# Patient Record
Sex: Male | Born: 1960 | Race: Black or African American | Hispanic: No | Marital: Married | Smoking: Never smoker
Health system: Southern US, Community
[De-identification: ages and names within clinical notes are randomized; demographics above are authoritative.]

## PROBLEM LIST (undated history)

## (undated) DIAGNOSIS — I10 Essential (primary) hypertension: Secondary | ICD-10-CM

---

## 2016-06-22 ENCOUNTER — Inpatient Hospital Stay (HOSPITAL_COMMUNITY): Payer: BLUE CROSS/BLUE SHIELD

## 2016-06-22 ENCOUNTER — Inpatient Hospital Stay (HOSPITAL_COMMUNITY)
Admission: EM | Admit: 2016-06-22 | Discharge: 2016-06-30 | DRG: 640 | Disposition: A | Discharge status: Home Health | Payer: BLUE CROSS/BLUE SHIELD | Attending: Family Medicine | Admitting: Family Medicine

## 2016-06-22 ENCOUNTER — Encounter (HOSPITAL_COMMUNITY): Payer: Self-pay | Admitting: Emergency Medicine

## 2016-06-22 ENCOUNTER — Emergency Department (HOSPITAL_COMMUNITY): Payer: BLUE CROSS/BLUE SHIELD

## 2016-06-22 DIAGNOSIS — N390 Urinary tract infection, site not specified: Secondary | ICD-10-CM | POA: Diagnosis present

## 2016-06-22 DIAGNOSIS — I951 Orthostatic hypotension: Secondary | ICD-10-CM

## 2016-06-22 DIAGNOSIS — K529 Noninfective gastroenteritis and colitis, unspecified: Secondary | ICD-10-CM | POA: Diagnosis present

## 2016-06-22 DIAGNOSIS — F10239 Alcohol dependence with withdrawal, unspecified: Secondary | ICD-10-CM | POA: Diagnosis present

## 2016-06-22 DIAGNOSIS — R42 Dizziness and giddiness: Secondary | ICD-10-CM | POA: Diagnosis present

## 2016-06-22 DIAGNOSIS — E46 Unspecified protein-calorie malnutrition: Secondary | ICD-10-CM | POA: Diagnosis present

## 2016-06-22 DIAGNOSIS — E43 Unspecified severe protein-calorie malnutrition: Secondary | ICD-10-CM | POA: Diagnosis present

## 2016-06-22 DIAGNOSIS — R531 Weakness: Secondary | ICD-10-CM | POA: Diagnosis not present

## 2016-06-22 DIAGNOSIS — R627 Adult failure to thrive: Secondary | ICD-10-CM | POA: Diagnosis present

## 2016-06-22 DIAGNOSIS — R296 Repeated falls: Secondary | ICD-10-CM | POA: Diagnosis present

## 2016-06-22 DIAGNOSIS — I10 Essential (primary) hypertension: Secondary | ICD-10-CM | POA: Diagnosis present

## 2016-06-22 DIAGNOSIS — R5381 Other malaise: Secondary | ICD-10-CM | POA: Diagnosis not present

## 2016-06-22 DIAGNOSIS — Z86718 Personal history of other venous thrombosis and embolism: Secondary | ICD-10-CM | POA: Diagnosis not present

## 2016-06-22 DIAGNOSIS — R29898 Other symptoms and signs involving the musculoskeletal system: Secondary | ICD-10-CM

## 2016-06-22 DIAGNOSIS — D638 Anemia in other chronic diseases classified elsewhere: Secondary | ICD-10-CM | POA: Diagnosis present

## 2016-06-22 DIAGNOSIS — R945 Abnormal results of liver function studies: Secondary | ICD-10-CM

## 2016-06-22 DIAGNOSIS — E876 Hypokalemia: Secondary | ICD-10-CM | POA: Diagnosis present

## 2016-06-22 DIAGNOSIS — Z6820 Body mass index (BMI) 20.0-20.9, adult: Secondary | ICD-10-CM | POA: Diagnosis not present

## 2016-06-22 DIAGNOSIS — Z833 Family history of diabetes mellitus: Secondary | ICD-10-CM

## 2016-06-22 DIAGNOSIS — R7989 Other specified abnormal findings of blood chemistry: Secondary | ICD-10-CM | POA: Diagnosis not present

## 2016-06-22 DIAGNOSIS — F1010 Alcohol abuse, uncomplicated: Secondary | ICD-10-CM

## 2016-06-22 DIAGNOSIS — F10288 Alcohol dependence with other alcohol-induced disorder: Secondary | ICD-10-CM

## 2016-06-22 DIAGNOSIS — F1020 Alcohol dependence, uncomplicated: Secondary | ICD-10-CM | POA: Diagnosis present

## 2016-06-22 DIAGNOSIS — I82409 Acute embolism and thrombosis of unspecified deep veins of unspecified lower extremity: Secondary | ICD-10-CM | POA: Diagnosis present

## 2016-06-22 DIAGNOSIS — Z8249 Family history of ischemic heart disease and other diseases of the circulatory system: Secondary | ICD-10-CM | POA: Diagnosis not present

## 2016-06-22 DIAGNOSIS — D6489 Other specified anemias: Secondary | ICD-10-CM | POA: Diagnosis present

## 2016-06-22 DIAGNOSIS — D649 Anemia, unspecified: Secondary | ICD-10-CM

## 2016-06-22 DIAGNOSIS — R160 Hepatomegaly, not elsewhere classified: Secondary | ICD-10-CM

## 2016-06-22 DIAGNOSIS — R260 Ataxic gait: Secondary | ICD-10-CM

## 2016-06-22 HISTORY — DX: Essential (primary) hypertension: I10

## 2016-06-22 LAB — IRON AND TIBC
Iron: 144 ug/dL (ref 45–182)
SATURATION RATIOS: 87 % — AB (ref 17.9–39.5)
TIBC: 165 ug/dL — AB (ref 250–450)
UIBC: 21 ug/dL

## 2016-06-22 LAB — COMPREHENSIVE METABOLIC PANEL
ALK PHOS: 140 U/L — AB (ref 38–126)
ALT: 27 U/L (ref 17–63)
ANION GAP: 19 — AB (ref 5–15)
AST: 114 U/L — ABNORMAL HIGH (ref 15–41)
Albumin: 3.5 g/dL (ref 3.5–5.0)
BILIRUBIN TOTAL: 2.6 mg/dL — AB (ref 0.3–1.2)
BUN: 12 mg/dL (ref 6–20)
CALCIUM: 9.1 mg/dL (ref 8.9–10.3)
CO2: 29 mmol/L (ref 22–32)
Chloride: 85 mmol/L — ABNORMAL LOW (ref 101–111)
Creatinine, Ser: 1.14 mg/dL (ref 0.61–1.24)
Glucose, Bld: 113 mg/dL — ABNORMAL HIGH (ref 65–99)
Potassium: 2.8 mmol/L — ABNORMAL LOW (ref 3.5–5.1)
SODIUM: 133 mmol/L — AB (ref 135–145)
TOTAL PROTEIN: 7.1 g/dL (ref 6.5–8.1)

## 2016-06-22 LAB — URINALYSIS, ROUTINE W REFLEX MICROSCOPIC
GLUCOSE, UA: NEGATIVE mg/dL
Ketones, ur: 15 mg/dL — AB
LEUKOCYTES UA: NEGATIVE
Nitrite: POSITIVE — AB
Protein, ur: 100 mg/dL — AB
SPECIFIC GRAVITY, URINE: 1.02 (ref 1.005–1.030)
pH: 6 (ref 5.0–8.0)

## 2016-06-22 LAB — URINE MICROSCOPIC-ADD ON

## 2016-06-22 LAB — VITAMIN B12: Vitamin B-12: 660 pg/mL (ref 180–914)

## 2016-06-22 LAB — TSH: TSH: 1.934 u[IU]/mL (ref 0.350–4.500)

## 2016-06-22 LAB — CBC
HCT: 24.5 % — ABNORMAL LOW (ref 39.0–52.0)
Hemoglobin: 8.5 g/dL — ABNORMAL LOW (ref 13.0–17.0)
MCH: 29.4 pg (ref 26.0–34.0)
MCHC: 34.7 g/dL (ref 30.0–36.0)
MCV: 84.8 fL (ref 78.0–100.0)
PLATELETS: 145 10*3/uL — AB (ref 150–400)
RBC: 2.89 MIL/uL — AB (ref 4.22–5.81)
RDW: 15.3 % (ref 11.5–15.5)
WBC: 5.8 10*3/uL (ref 4.0–10.5)

## 2016-06-22 LAB — MAGNESIUM: MAGNESIUM: 0.9 mg/dL — AB (ref 1.7–2.4)

## 2016-06-22 LAB — RAPID URINE DRUG SCREEN, HOSP PERFORMED
AMPHETAMINES: NOT DETECTED
BENZODIAZEPINES: NOT DETECTED
Barbiturates: NOT DETECTED
COCAINE: NOT DETECTED
OPIATES: NOT DETECTED
Tetrahydrocannabinol: NOT DETECTED

## 2016-06-22 LAB — FERRITIN: Ferritin: 2671 ng/mL — ABNORMAL HIGH (ref 24–336)

## 2016-06-22 LAB — ETHANOL: ALCOHOL ETHYL (B): 8 mg/dL — AB (ref <5)

## 2016-06-22 LAB — CBG MONITORING, ED: Glucose-Capillary: 99 mg/dL (ref 65–99)

## 2016-06-22 MED ORDER — VITAMIN B-1 100 MG PO TABS
100.0000 mg | ORAL_TABLET | Freq: Every day | ORAL | Status: DC
  Administered 2016-06-22 – 2016-06-30 (×9): 100 mg via ORAL
  Filled 2016-06-22 (×9): qty 1

## 2016-06-22 MED ORDER — APIXABAN 5 MG PO TABS
5.0000 mg | ORAL_TABLET | Freq: Two times a day (BID) | ORAL | Status: DC
  Administered 2016-06-22 – 2016-06-30 (×16): 5 mg via ORAL
  Filled 2016-06-22 (×17): qty 1

## 2016-06-22 MED ORDER — MAGNESIUM SULFATE 2 GM/50ML IV SOLN
2.0000 g | Freq: Once | INTRAVENOUS | Status: AC
  Administered 2016-06-22: 2 g via INTRAVENOUS
  Filled 2016-06-22: qty 50

## 2016-06-22 MED ORDER — LORAZEPAM 2 MG/ML IJ SOLN
0.0000 mg | Freq: Two times a day (BID) | INTRAMUSCULAR | Status: DC
Start: 2016-06-24 — End: 2016-06-23

## 2016-06-22 MED ORDER — MAGNESIUM OXIDE 400 (241.3 MG) MG PO TABS
800.0000 mg | ORAL_TABLET | Freq: Two times a day (BID) | ORAL | Status: DC
  Administered 2016-06-22 – 2016-06-30 (×16): 800 mg via ORAL
  Filled 2016-06-22 (×20): qty 2

## 2016-06-22 MED ORDER — POTASSIUM CHLORIDE CRYS ER 20 MEQ PO TBCR
60.0000 meq | EXTENDED_RELEASE_TABLET | Freq: Once | ORAL | Status: AC
  Administered 2016-06-22: 60 meq via ORAL
  Filled 2016-06-22: qty 3

## 2016-06-22 MED ORDER — RAMIPRIL 5 MG PO CAPS
10.0000 mg | ORAL_CAPSULE | Freq: Every day | ORAL | Status: DC
  Administered 2016-06-23 – 2016-06-25 (×3): 10 mg via ORAL
  Filled 2016-06-22 (×3): qty 2

## 2016-06-22 MED ORDER — POTASSIUM CHLORIDE CRYS ER 20 MEQ PO TBCR: 40.0000 meq | EXTENDED_RELEASE_TABLET | Freq: Two times a day (BID) | ORAL | Status: DC

## 2016-06-22 MED ORDER — VERAPAMIL HCL ER 240 MG PO TBCR
240.0000 mg | EXTENDED_RELEASE_TABLET | Freq: Every day | ORAL | Status: DC
  Administered 2016-06-23 – 2016-06-25 (×3): 240 mg via ORAL
  Filled 2016-06-22 (×3): qty 1

## 2016-06-22 MED ORDER — FOLIC ACID 1 MG PO TABS
1.0000 mg | ORAL_TABLET | Freq: Every day | ORAL | Status: DC
  Administered 2016-06-22 – 2016-06-30 (×9): 1 mg via ORAL
  Filled 2016-06-22 (×9): qty 1

## 2016-06-22 MED ORDER — LORAZEPAM 2 MG/ML IJ SOLN
0.0000 mg | Freq: Four times a day (QID) | INTRAMUSCULAR | Status: DC
Start: 2016-06-22 — End: 2016-06-23
  Administered 2016-06-22 – 2016-06-23 (×3): 1 mg via INTRAVENOUS
  Administered 2016-06-23: 3 mg via INTRAVENOUS
  Filled 2016-06-22: qty 1
  Filled 2016-06-22: qty 2
  Filled 2016-06-22 (×2): qty 1

## 2016-06-22 MED ORDER — LORAZEPAM 2 MG/ML IJ SOLN
1.0000 mg | Freq: Four times a day (QID) | INTRAMUSCULAR | Status: DC | PRN
  Administered 2016-06-22: 1 mg via INTRAVENOUS
  Filled 2016-06-22: qty 1

## 2016-06-22 MED ORDER — POTASSIUM CHLORIDE 20 MEQ/15ML (10%) PO SOLN
ORAL | Status: AC
  Administered 2016-06-22: 16:00:00
  Filled 2016-06-22: qty 30

## 2016-06-22 MED ORDER — FENOFIBRATE 54 MG PO TABS
54.0000 mg | ORAL_TABLET | Freq: Every day | ORAL | Status: DC
  Administered 2016-06-23 – 2016-06-30 (×8): 54 mg via ORAL
  Filled 2016-06-22 (×9): qty 1

## 2016-06-22 MED ORDER — SODIUM CHLORIDE 0.9 % IV BOLUS (SEPSIS)
1000.0000 mL | Freq: Once | INTRAVENOUS | Status: AC
  Administered 2016-06-22: 1000 mL via INTRAVENOUS

## 2016-06-22 MED ORDER — ONDANSETRON HCL 4 MG/2ML IJ SOLN: 4.0000 mg | Freq: Four times a day (QID) | INTRAMUSCULAR | Status: DC | PRN

## 2016-06-22 MED ORDER — POTASSIUM CHLORIDE CRYS ER 20 MEQ PO TBCR
60.0000 meq | EXTENDED_RELEASE_TABLET | Freq: Two times a day (BID) | ORAL | Status: DC
  Administered 2016-06-22 – 2016-06-24 (×4): 60 meq via ORAL
  Filled 2016-06-22 (×4): qty 3

## 2016-06-22 MED ORDER — ONDANSETRON HCL 4 MG PO TABS
4.0000 mg | ORAL_TABLET | Freq: Four times a day (QID) | ORAL | Status: DC | PRN
Start: 2016-06-22 — End: 2016-06-30

## 2016-06-22 MED ORDER — HYDROCHLOROTHIAZIDE 25 MG PO TABS
25.0000 mg | ORAL_TABLET | Freq: Every day | ORAL | Status: DC
  Administered 2016-06-23 – 2016-06-24 (×2): 25 mg via ORAL
  Filled 2016-06-22 (×2): qty 1

## 2016-06-22 MED ORDER — SODIUM CHLORIDE 0.9 % IV SOLN
Freq: Once | INTRAVENOUS | Status: AC
  Administered 2016-06-22: 16:00:00 via INTRAVENOUS

## 2016-06-22 MED ORDER — ACETAMINOPHEN 650 MG RE SUPP: 650.0000 mg | Freq: Four times a day (QID) | RECTAL | Status: DC | PRN

## 2016-06-22 MED ORDER — SODIUM CHLORIDE 0.9% FLUSH
3.0000 mL | Freq: Two times a day (BID) | INTRAVENOUS | Status: DC
  Administered 2016-06-23 – 2016-06-29 (×11): 3 mL via INTRAVENOUS

## 2016-06-22 MED ORDER — THIAMINE HCL 100 MG/ML IJ SOLN
100.0000 mg | Freq: Once | INTRAMUSCULAR | Status: AC
  Administered 2016-06-22: 100 mg via INTRAVENOUS
  Filled 2016-06-22: qty 2

## 2016-06-22 MED ORDER — THIAMINE HCL 100 MG/ML IJ SOLN: 100.0000 mg | Freq: Every day | INTRAMUSCULAR | Status: DC

## 2016-06-22 MED ORDER — LOPERAMIDE HCL 2 MG PO CAPS: 2.0000 mg | ORAL_CAPSULE | ORAL | Status: DC | PRN

## 2016-06-22 MED ORDER — ADULT MULTIVITAMIN W/MINERALS CH
1.0000 | ORAL_TABLET | Freq: Every day | ORAL | Status: DC
  Administered 2016-06-22 – 2016-06-30 (×9): 1 via ORAL
  Filled 2016-06-22 (×9): qty 1

## 2016-06-22 MED ORDER — ACETAMINOPHEN 325 MG PO TABS: 650.0000 mg | ORAL_TABLET | Freq: Four times a day (QID) | ORAL | Status: DC | PRN

## 2016-06-22 MED ORDER — LORAZEPAM 1 MG PO TABS: 1.0000 mg | ORAL_TABLET | Freq: Four times a day (QID) | ORAL | Status: DC | PRN

## 2016-06-22 NOTE — H&P (Signed)
History and Physical  Corey Hartman Zulauf ZOX:[id number] DOB: 21-Mar-1961 DOA: 06/22/2016  Referring physician: Dr Jodi MourningZavitz, ED physician PCP: Lenoria ChimeWhite, Valerie A, FNP  Outpatient Specialists:   Dr Gerilyn Pilgrimoonquah (Neurology)  Chief Complaint: Weakness, failure to thrive  HPI: Corey Hartman Bonus is a 55 y.o. male with a history of hypertension, orthostasis, alcoholism. Patient presents from Dr. Ronal Fearoonquah's office due to failure to thrive and weakness. He was seen there for follow-up of his orthostasis and weakness was noted to have a 30 pound weight loss in the past several months. In questioning the patient, the patient has been drinking 5-6 drinks of liquor every night. His oral food intake has been pretty minimal. He does report chronic diarrhea. His symptoms are worsening. His been noticing increased weakness with presyncopal symptoms that have been increasing. He has been prescribed Midodrin for the orthostasis, but he has not started this yet. His last alcohol was yesterday.  Emergency Department Course: The emergency departments CMP shows elevated AST and hypokalemia and hypomagnesemia. Patient was started on magnesium and potassium. Additionally, folic acid and thiamine were given in the emergency department. He was also noted to be anemic to a hemoglobin of 8.5.   Review of Systems:   He does report chronic diarrhea  Pt denies any fevers, chills, nausea, vomiting, constipation, abdominal pain, shortness of breath, dyspnea on exertion, orthopnea, cough, wheezing, palpitations, headache, vision changes, lightheadedness, dizziness, melena, rectal bleeding.  Review of systems are otherwise negative  Past Medical History:  Diagnosis Date  . Hypertension    History reviewed. No pertinent surgical history. Social History:  reports that he has never smoked. He has never used smokeless tobacco. He reports that he drinks about 21.0 oz of alcohol per week . He reports that he does not use drugs. Patient lives  at Home  No Known Allergies  Family History  Problem Relation Age of Onset  . Hypertension Father   . Diabetes Father      Prior to Admission medications   Not on File    Physical Exam: BP 145/82   Pulse 94   Ht 6\' 2"  (1.88 m)   Wt 74.8 kg (165 lb)   SpO2 99%   BMI 21.18 kg/m   General: Middle-aged black male. Awake and alert and oriented x3. No acute cardiopulmonary distress.  HEENT: Normocephalic atraumatic.  Right and left ears normal in appearance.  Pupils equal, round, reactive to light. Extraocular muscles are intact. Sclerae anicteric and noninjected.  Moist mucosal membranes. No mucosal lesions.  Neck: Neck supple without lymphadenopathy. No carotid bruits. No masses palpated.  Cardiovascular: Regular rate with normal S1-S2 sounds. No murmurs, rubs, gallops auscultated. No JVD.  Respiratory: Good respiratory effort with no wheezes, rales, rhonchi. Lungs clear to auscultation bilaterally.  No accessory muscle use. Abdomen: Soft, nontender, nondistended. Active bowel sounds. No masses or hepatosplenomegaly  Skin: No rashes, lesions, or ulcerations.  Dry, warm to touch. 2+ dorsalis pedis and radial pulses. Musculoskeletal: Left calf tenderness, although negative Homans sign and no edema or erythema. No leg pain. All major joints not erythematous nontender.  No upper or lower joint deformation.  Good ROM.  No contractures  Psychiatric: Intact judgment and insight. Pleasant and cooperative. Neurologic: No focal neurological deficits. Strength is 5/5 and symmetric in upper and lower extremities.  Cranial nerves II through XII are grossly intact.           Labs on Admission: I have personally reviewed following labs and imaging studies  CBC:  Recent Labs  Lab 06/22/16 1418  WBC 5.8  HGB 8.5*  HCT 24.5*  MCV 84.8  PLT 145*   Basic Metabolic Panel:  Recent Labs Lab 06/22/16 1418  NA 133*  K 2.8*  CL 85*  CO2 29  GLUCOSE 113*  BUN 12  CREATININE 1.14    CALCIUM 9.1  MG 0.9*   GFR: Estimated Creatinine Clearance: 79.3 mL/min (by C-G formula based on SCr of 1.14 mg/dL). Liver Function Tests:  Recent Labs Lab 06/22/16 1418  AST 114*  ALT 27  ALKPHOS 140*  BILITOT 2.6*  PROT 7.1  ALBUMIN 3.5   No results for input(s): LIPASE, AMYLASE in the last 168 hours. No results for input(s): AMMONIA in the last 168 hours. Coagulation Profile: No results for input(s): INR, PROTIME in the last 168 hours. Cardiac Enzymes: No results for input(s): CKTOTAL, CKMB, CKMBINDEX, TROPONINI in the last 168 hours. BNP (last 3 results) No results for input(s): PROBNP in the last 8760 hours. HbA1C: No results for input(s): HGBA1C in the last 72 hours. CBG:  Recent Labs Lab 06/22/16 1353  GLUCAP 99   Lipid Profile: No results for input(s): CHOL, HDL, LDLCALC, TRIG, CHOLHDL, LDLDIRECT in the last 72 hours. Thyroid Function Tests:  Recent Labs  06/22/16 1418  TSH 1.934   Anemia Panel: No results for input(s): VITAMINB12, FOLATE, FERRITIN, TIBC, IRON, RETICCTPCT in the last 72 hours. Urine analysis: No results found for: COLORURINE, APPEARANCEUR, LABSPEC, PHURINE, GLUCOSEU, HGBUR, BILIRUBINUR, KETONESUR, PROTEINUR, UROBILINOGEN, NITRITE, LEUKOCYTESUR Sepsis Labs: @LABRCNTIP (procalcitonin:4,lacticidven:4) )No results found for this or any previous visit (from the past 240 hour(s)).   Radiological Exams on Admission: No results found.  EKG: Independently reviewed. Sinus rhythm with LVH.  Inverted T waves throughout.. No ST segment elevation or depression.  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  We also discussed care  given by the ED provider.  #1 failure to thrive in adult  Admit on telemetry secondary to electrolyte abnormalities  Regular diet  Nutrition consult #2 hypokalemia  Potassium 40 mEq twice daily by mouth  Check potassium in the morning #3 hypomagnesemia  Patient getting 2 g of magnesium IV  Magnesium 128 mg twice daily  Check magnesium tomorrow #4 elevated LFTs  Likely alcohol induced  Ultrasound to evaluate for cirrhosis #5 alcohol abuse  Withdrawal protocol  Thiamine daily  Folic acid daily #6 anemia secondary to alcoholism  Anemia is likely secondary to alcohol  Check folic acid, vitamin B-12, iron studies #7 orthostasis   Orthostatic vitals  Continue midodrine #8 essential hypertension  Check blood pressure medication #9 weakness  PT consult  DVT prophylaxis: Eliquis Consultants: PT, Nutrition Code Status: Full Family Communication: Wife and daughters in the room  Disposition Plan: pending.   Levie HeritageJacob J Jaidin Ugarte, DO [facility] Pager [phone]  If 7PM-7AM, please contact night-coverage [email or web address] Password [email or web address]

## 2016-06-22 NOTE — ED Provider Notes (Signed)
[facility]-EMERGENCY DEPT Provider Note   CSN: [id number] Arrival date & time: 06/22/16  1307     History   Chief Complaint Chief Complaint  Patient presents with  . Medical Clearance    HPI Corey Hartman is a 55 y.o. male.  Patient presents to the ER for further medical and psychiatric evaluation. Patient has been followed by neurology Dr. do and call and has had significant physical and mental decline over the past 2 months. Patient is a chronic alcoholic and drinks 6 liquor beverages per day. Patient does not feel he has a problem. Patient presents with family members who are worried about his decline. No fevers or chills, patient has had lightheaded/with a static symptoms when he stands. This is led to frequent falls the past few months. Patient denies recent head injury. No infectious symptoms. No stroke history patient has had blood pressure and is compliant with medicines however has not filled his last prescription. Patient has had unintended weight loss and has had decreased appetite past few months.      Past Medical History:  Diagnosis Date  . Hypertension     Patient Active Problem List   Diagnosis Date Noted  . Alcohol abuse 06/22/2016    History reviewed. No pertinent surgical history.     Home Medications    Prior to Admission medications   Not on File    Family History Family History  Problem Relation Age of Onset  . Hypertension Father   . Diabetes Father     Social History Social History  Substance Use Topics  . Smoking status: Never Smoker  . Smokeless tobacco: Never Used  . Alcohol use 21.0 oz/week    35 Shots of liquor per week     Comment: 4-5 drinks daily     Allergies   Patient has no known allergies.   Review of Systems Review of Systems  Constitutional: Positive for appetite change and unexpected weight change. Negative for chills and fever.  HENT: Negative for congestion.   Eyes: Negative for visual disturbance.    Respiratory: Negative for shortness of breath.   Cardiovascular: Negative for chest pain.  Gastrointestinal: Negative for abdominal pain and vomiting.  Genitourinary: Negative for dysuria and flank pain.  Musculoskeletal: Positive for gait problem. Negative for back pain, neck pain and neck stiffness.  Skin: Negative for rash.  Neurological: Positive for weakness and light-headedness (general). Negative for headaches.     Physical Exam Updated Vital Signs BP 144/94   Pulse 103   Ht 6\' 2"  (1.88 m)   Wt 165 lb (74.8 kg)   SpO2 100%   BMI 21.18 kg/m   Physical Exam  Constitutional: He is oriented to person, place, and time. He appears well-developed.  HENT:  Head: Normocephalic and atraumatic.  Dry mucous membranes  Eyes: Conjunctivae are normal. Right eye exhibits no discharge. Left eye exhibits no discharge.  Neck: Normal range of motion. Neck supple. No tracheal deviation present.  Cardiovascular: Normal rate and regular rhythm.   Pulmonary/Chest: Effort normal and breath sounds normal.  Abdominal: Soft. He exhibits no distension. There is no tenderness. There is no guarding.  Musculoskeletal: He exhibits no edema.  Neurological: He is alert and oriented to person, place, and time.  Patient alert to name date of birth, location, family members. Patient recalls this morning events however has had issues with short-term memory. 5+ strength in UE and LE with f/e at major joints. Sensation to palpation intact in UE and LE. CNs  2-12 grossly intact.  EOMFI.  PERRL.   Finger nose and coordination intact bilateral.   Visual fields intact to finger testing. No nystagmus Patient unable to ambulate due to general weakness and unsteady gait. Gen weakness   Skin: Skin is warm. No rash noted.  Psychiatric: He has a normal mood and affect.  Nursing note and vitals reviewed.    ED Treatments / Results  Labs (all labs ordered are listed, but only abnormal results are  displayed) Labs Reviewed  COMPREHENSIVE METABOLIC PANEL - Abnormal; Notable for the following:       Result Value   Sodium 133 (*)    Potassium 2.8 (*)    Chloride 85 (*)    Glucose, Bld 113 (*)    AST 114 (*)    Alkaline Phosphatase 140 (*)    Total Bilirubin 2.6 (*)    Anion gap 19 (*)    All other components within normal limits  ETHANOL - Abnormal; Notable for the following:    Alcohol, Ethyl (B) 8 (*)    All other components within normal limits  CBC - Abnormal; Notable for the following:    RBC 2.89 (*)    Hemoglobin 8.5 (*)    HCT 24.5 (*)    Platelets 145 (*)    All other components within normal limits  MAGNESIUM - Abnormal; Notable for the following:    Magnesium 0.9 (*)    All other components within normal limits  TSH  RAPID URINE DRUG SCREEN, HOSP PERFORMED  URINALYSIS, ROUTINE W REFLEX MICROSCOPIC (NOT AT [facility])  CBG MONITORING, ED    EKG  EKG Interpretation  Date/Time:  Monday June 22 2016 13:58:05 EST Ventricular Rate:  96 PR Interval:  180 QRS Duration: 84 QT Interval:  340 QTC Calculation: 429 R Axis:   5 Text Interpretation:  Normal sinus rhythm Moderate voltage criteria for LVH, may be normal variant T wave abnormality, consider inferolateral ischemia Abnormal ECG Confirmed by Jodi Mourning MD, Ivin Booty ([id number]) on 06/22/2016 2:06:22 PM Also confirmed by Jodi Mourning MD, Ivin Booty ([id number])  on 06/22/2016 4:02:04 PM       Radiology No results found.  Procedures Procedures (including critical care time)  Medications Ordered in ED Medications  potassium chloride SA (K-DUR,KLOR-CON) CR tablet 60 mEq (not administered)  magnesium sulfate IVPB 2 g 50 mL (not administered)  0.9 %  sodium chloride infusion (not administered)  thiamine (B-1) injection 100 mg (100 mg Intravenous Given 06/22/16 1451)  sodium chloride 0.9 % bolus 1,000 mL (1,000 mLs Intravenous New Bag/Given 06/22/16 1451)     Initial Impression / Assessment and Plan / ED Course  I have reviewed  the triage vital signs and the nursing notes.  Pertinent labs & imaging results that were available during my care of the patient were reviewed by me and considered in my medical decision making (see chart for details).  Clinical Course    Patient presents with general weakness and deconditioning that is significant worsening the past 2 months. Likely, nation of chronic alcohol abuse and orthostatic symptoms. Electrolytes abnormal, replacement started in the ER. IV fluids given. TTS and case manager consult at. Patient unable to and related general weakness. Plan for admission the hospital as patient is not medically clear at this time for alcohol treatment.  The patients results and plan were reviewed and discussed.   Any x-rays performed were independently reviewed by myself.   Differential diagnosis were considered with the presenting HPI.  Medications  potassium chloride SA (  K-DUR,KLOR-CON) CR tablet 60 mEq (not administered)  magnesium sulfate IVPB 2 g 50 mL (not administered)  0.9 %  sodium chloride infusion (not administered)  thiamine (B-1) injection 100 mg (100 mg Intravenous Given 06/22/16 1451)  sodium chloride 0.9 % bolus 1,000 mL (1,000 mLs Intravenous New Bag/Given 06/22/16 1451)    Vitals:   06/22/16 1321 06/22/16 1430 06/22/16 1500  BP:  144/94 144/94  Pulse:  103 103  SpO2:  100%   Weight: 165 lb (74.8 kg)    Height: 6\' 2"  (1.88 m)      Final diagnoses:  Alcohol abuse  Muscular deconditioning  Hypokalemia  Hypomagnesemia  Anemia, unspecified type    Admission/ observation were discussed with the admitting physician, patient and/or family and they are comfortable with the plan.    Final Clinical Impressions(s) / ED Diagnoses   Final diagnoses:  Alcohol abuse  Muscular deconditioning  Hypokalemia  Hypomagnesemia  Anemia, unspecified type    New Prescriptions New Prescriptions   No medications on file     Blane OharaJoshua Bethany Hirt, MD 06/22/16 1606

## 2016-06-22 NOTE — ED Notes (Signed)
Pt taken to US at this time

## 2016-06-22 NOTE — ED Notes (Signed)
Pt not able to stand during orthostatics

## 2016-06-22 NOTE — ED Notes (Signed)
Per vo Dr Adrian BlackwaterStinson, cancel tts consult until further notice.

## 2016-06-22 NOTE — ED Notes (Signed)
Attempted to call [facility] x 2 to make them aware hospitalitis cancelled tts until further notice.

## 2016-06-22 NOTE — ED Triage Notes (Signed)
Family brings pt from Dr Rudean Haskellooquah's office for medical and psychological evaluation. Per Dr's note pt has been in a state of decline with orthostatic pressures, diziness and falls. Pt not eating and continuing too use alcohol, refuses assistance from his family.

## 2016-06-23 DIAGNOSIS — D6489 Other specified anemias: Secondary | ICD-10-CM

## 2016-06-23 DIAGNOSIS — F10288 Alcohol dependence with other alcohol-induced disorder: Secondary | ICD-10-CM

## 2016-06-23 LAB — BASIC METABOLIC PANEL
Anion gap: 11 (ref 5–15)
BUN: 14 mg/dL (ref 6–20)
CALCIUM: 8.4 mg/dL — AB (ref 8.9–10.3)
CO2: 30 mmol/L (ref 22–32)
CREATININE: 0.86 mg/dL (ref 0.61–1.24)
Chloride: 92 mmol/L — ABNORMAL LOW (ref 101–111)
GFR calc Af Amer: >60 mL/min (ref >60)
GLUCOSE: 110 mg/dL — AB (ref 65–99)
Potassium: 3 mmol/L — ABNORMAL LOW (ref 3.5–5.1)
SODIUM: 133 mmol/L — AB (ref 135–145)

## 2016-06-23 LAB — MAGNESIUM: MAGNESIUM: 1.3 mg/dL — AB (ref 1.7–2.4)

## 2016-06-23 LAB — FOLATE: Folate: 5.6 ng/mL — ABNORMAL LOW (ref >5.9)

## 2016-06-23 MED ORDER — LORAZEPAM 2 MG/ML IJ SOLN
0.0000 mg | Freq: Two times a day (BID) | INTRAMUSCULAR | Status: AC
  Administered 2016-06-26: 1 mg via INTRAVENOUS
  Administered 2016-06-26: 2 mg via INTRAVENOUS
  Filled 2016-06-23 (×3): qty 1

## 2016-06-23 MED ORDER — LORAZEPAM 2 MG/ML IJ SOLN: 2.0000 mg | Freq: Once | INTRAMUSCULAR | Status: DC

## 2016-06-23 MED ORDER — LORAZEPAM 2 MG/ML IJ SOLN
0.0000 mg | INTRAMUSCULAR | Status: AC
  Administered 2016-06-23 – 2016-06-24 (×3): 4 mg via INTRAVENOUS
  Administered 2016-06-24: 1 mg via INTRAVENOUS
  Administered 2016-06-24 (×3): 4 mg via INTRAVENOUS
  Administered 2016-06-25 (×2): 1 mg via INTRAVENOUS
  Administered 2016-06-25: 2 mg via INTRAVENOUS
  Filled 2016-06-23 (×4): qty 2
  Filled 2016-06-23: qty 1
  Filled 2016-06-23: qty 2
  Filled 2016-06-23: qty 1
  Filled 2016-06-23: qty 2
  Filled 2016-06-23: qty 1

## 2016-06-23 MED ORDER — ENSURE ENLIVE PO LIQD
237.0000 mL | Freq: Two times a day (BID) | ORAL | Status: DC
  Administered 2016-06-24 – 2016-06-29 (×10): 237 mL via ORAL

## 2016-06-23 MED ORDER — LORAZEPAM 2 MG/ML IJ SOLN
1.0000 mg | Freq: Four times a day (QID) | INTRAMUSCULAR | Status: AC | PRN
  Administered 2016-06-23 – 2016-06-25 (×4): 1 mg via INTRAVENOUS
  Filled 2016-06-23 (×4): qty 1

## 2016-06-23 MED ORDER — LORAZEPAM 1 MG PO TABS: 1.0000 mg | ORAL_TABLET | Freq: Four times a day (QID) | ORAL | Status: AC | PRN

## 2016-06-23 NOTE — Discharge Instructions (Signed)
Alcohol Abuse and Nutrition °Alcohol abuse is any pattern of alcohol consumption that harms your health, relationships, or work. Alcohol abuse can affect how your body breaks down and absorbs nutrients from food by causing your liver to work abnormally. Additionally, many people who abuse alcohol do not eat enough carbohydrates, protein, fat, vitamins, and minerals. This can cause poor nutrition (malnutrition) and a lack of nutrients (nutrient deficiencies), which can lead to further complications. °Nutrients that are commonly lacking (deficient) among people who abuse alcohol include: °· Vitamins. °¨ Vitamin A. This is stored in your liver. It is important for your vision, metabolism, and ability to fight off infections (immunity). °¨ B vitamins. These include vitamins such as folate, thiamin, and niacin. These are important in new cell growth and maintenance. °¨ Vitamin C. This plays an important role in iron absorption, wound healing, and immunity. °¨ Vitamin D. This is produced by your liver, but you can also get vitamin D from food. Vitamin D is necessary for your body to absorb and use calcium. °· Minerals. °¨ Calcium. This is important for your bones and your heart and blood vessel (cardiovascular) function. °¨ Iron. This is important for blood, muscle, and nervous system functioning. °¨ Magnesium. This plays an important role in muscle and nerve function, and it helps to control blood sugar and blood pressure. °¨ Zinc. This is important for the normal function of your nervous system and digestive system (gastrointestinal tract). °Nutrition is an essential component of therapy for alcohol abuse. Your health care provider or dietitian will work with you to design a plan that can help restore nutrients to your body and prevent potential complications. °What is my plan? °Your dietitian may develop a specific diet plan that is based on your condition and any other complications you may have. A diet plan will  commonly include: °· A balanced diet. °¨ Grains: 6-8 oz per day. °¨ Vegetables: 2-3 cups per day. °¨ Fruits: 1-2 cups per day. °¨ Meat and other protein: 5-6 oz per day. °¨ Dairy: 2-3 cups per day. °· Vitamin and mineral supplements. °What do I need to know about alcohol and nutrition? °· Consume foods that are high in antioxidants, such as grapes, berries, nuts, green tea, and dark green and orange vegetables. This can help to counteract some of the stress that is placed on your liver by consuming alcohol. °· Avoid food and drinks that are high in fat and sugar. Foods such as sugared soft drinks, salty snack foods, and candy contain empty calories. This means that they lack important nutrients such as protein, fiber, and vitamins. °· Eat frequent meals and snacks. Try to eat 5-6 small meals each day. °· Eat a variety of fresh fruits and vegetables each day. This will help you get plenty of water, fiber, and vitamins in your diet. °· Drink plenty of water and other clear fluids. Try to drink at least 48-64 oz (1.5-2 L) of water per day. °· If you are a vegetarian, eat a variety of protein-rich foods. Pair whole grains with plant-based proteins at meals and snacks to obtain the greatest nutrient benefit from your food. For example, eat rice with beans, put peanut butter on whole-grain toast, or eat oatmeal with sunflower seeds. °· Soak beans and whole grains overnight before cooking. This can help your body to absorb the nutrients more easily. °· Include foods fortified with vitamins and minerals in your diet. Commonly fortified foods include milk, orange juice, cereal, and bread. °· If you   are malnourished, your dietitian may recommend a high-protein, high-calorie diet. This may include: °¨ 2,000-3,000 calories (kilocalories) per day. °¨ 70-100 grams of protein per day. °· Your health care provider may recommend a complete nutritional supplement beverage. This can help to restore calories, protein, and vitamins to  your body. Depending on your condition, you may be advised to consume this instead of or in addition to meals. °· Limit your intake of caffeine. Replace drinks like coffee and black tea with decaffeinated coffee and herbal tea. °· Eat a variety of foods that are high in omega fatty acids. These include fish, nuts and seeds, and soybeans. These foods may help your liver to recover and may also stabilize your mood. °· Certain medicines may cause changes in your appetite, taste, and weight. Work with your health care provider and dietitian to make any adjustments to your medicines and diet plan. °· Include other healthy lifestyle choices in your daily routine. °¨ Be physically active. °¨ Get enough sleep. °¨ Spend time doing activities that you enjoy. °· If you are unable to take in enough food and calories by mouth, your health care provider may recommend a feeding tube. This is a tube that passes through your nose and throat, directly into your stomach. Nutritional supplement beverages can be given to you through the feeding tube to help you get the nutrients you need. °· Take vitamin or mineral supplements as recommended by your health care provider. °What foods can I eat? °Grains  °Enriched pasta. Enriched rice. Fortified whole-grain bread. Fortified whole-grain cereal. Barley. Brown rice. Quinoa. Millet. °Vegetables  °All fresh, frozen, and canned vegetables. Spinach. Kale. Artichoke. Carrots. Winter squash and pumpkin. Sweet potatoes. Broccoli. Cabbage. Cucumbers. Tomatoes. Sweet peppers. Green beans. Peas. Corn. °Fruits  °All fresh and frozen fruits. Berries. Grapes. Mango. Papaya. Guava. Cherries. Apples. Bananas. Peaches. Plums. Pineapple. Watermelon. Cantaloupe. Oranges. Avocado. °Meats and Other Protein Sources  °Beef liver. Lean beef. Pork. Fresh and canned chicken. Fresh fish. Oysters. Sardines. Canned tuna. Shrimp. Eggs with yolks. Nuts and seeds. Peanut butter. Beans and lentils. Soybeans. Tofu. °Dairy    °Whole, low-fat, and nonfat milk. Whole, low-fat, and nonfat yogurt. Cottage cheese. Sour cream. Hard and soft cheeses. °Beverages  °Water. Herbal tea. Decaffeinated coffee. Decaffeinated green tea. 100% fruit juice. 100% vegetable juice. Instant breakfast shakes. °Condiments  °Ketchup. Mayonnaise. Mustard. Salad dressing. Barbecue sauce. °Sweets and Desserts  °Sugar-free ice cream. Sugar-free pudding. Sugar-free gelatin. °Fats and Oils  °Butter. Vegetable oil, flaxseed oil, olive oil, and walnut oil. °Other  °Complete nutrition shakes. Protein bars. Sugar-free gum. °The items listed above may not be a complete list of recommended foods or beverages. Contact your dietitian for more options.  °What foods are not recommended? °Grains  °Sugar-sweetened breakfast cereals. Flavored instant oatmeal. Fried breads. °Vegetables  °Breaded or deep-fried vegetables. °Fruits  °Dried fruit with added sugar. Candied fruit. Canned fruit in syrup. °Meats and Other Protein Sources  °Breaded or deep-fried meats. °Dairy  °Flavored milks. Fried cheese curds or fried cheese sticks. °Beverages  °Alcohol. Sugar-sweetened soft drinks. Sugar-sweetened tea. Caffeinated coffee and tea. °Condiments  °Sugar. Honey. Agave nectar. Molasses. °Sweets and Desserts  °Chocolate. Cake. Cookies. Candy. °Other  °Potato chips. Pretzels. Salted nuts. Candied nuts. °The items listed above may not be a complete list of foods and beverages to avoid. Contact your dietitian for more information.  °This information is not intended to replace advice given to you by your health care provider. Make sure you discuss any questions you   have with your health care provider. °Document Released: 05/07/2005 Document Revised: 11/20/2015 Document Reviewed: 02/13/2014 °[employer]vier Interactive Patient Education © 2017 [employer] ° °

## 2016-06-23 NOTE — Progress Notes (Addendum)
Initial Nutrition Assessment  DOCUMENTATION CODES:  Pt meets criteria for severe MALNUTRITION in the context of chronic illness  as evidenced by unplanned wt loss 13% in <6 months and energy intake </= 75% in >/= 1 month.    INTERVENTION:  Ensure Enlive po BID, each supplement provides 350 kcal and 20 grams of protein   Heart Healthy diet   Vitamin therapy   Prior to discharge:  provide pt with Alcohol Abuse and Nutrition Education   NUTRITION DIAGNOSIS:    GOAL:  Pt to meet >/= 90% of their estimated nutrition needs      MONITOR: Po intake, labs and wt trends     REASON FOR ASSESSMENT:   Malnutrition Screen     ASSESSMENT:  Per ZO:[id number]:Corey Hartman is a 55 y.o. male with a history of hypertension, orthostasis, alcoholism. Patient presents from Dr. Ronal Fearoonquah's office due to failure to thrive and weakness. He was seen there for follow-up of his orthostasis and weakness was noted to have a 30 pound weight loss in the past several months. In questioning the patient, the patient has been drinking 5-6 drinks of liquor every night. His oral food intake has been pretty minimal. He does report chronic diarrhea. His symptoms are worsening. His been noticing increased weakness with presyncopal symptoms that have been increasing. He has been prescribed Midodrin for the orthostasis, but he has not started this yet. His last alcohol was yesterday.   Patient is asking when his food will be here. He is reporting good intake of meals so far -no meal intake data available to compare.  Patient affirms he has not been eating well for several months at home because of "drinking too much". Patient says he usually skips breakfast, has a sandwich for lunch or leftovers and wife cooks at night. Expect that he is chronically undernourished due to his excessive daily alcohol intake.   His weight is down 13% in the past 4 months which is severe for timeframe.  Nutrition-Focused physical exam findings are moderate  fat  depletion, moderate to severe muscle depletion, and no edema.    Recent Labs Lab 06/22/16 1418 06/23/16 0522  NA 133* 133*  K 2.8* 3.0*  CL 85* 92*  CO2 29 30  BUN 12 14  CREATININE 1.14 0.86  CALCIUM 9.1 8.4*  MG 0.9* 1.3*  GLUCOSE 113* 110*   Abnormal Labs: Mag, Sodium, potassium   Meds/vitamins: folate, multivitamin, Mag-ox, Thiamin   Diet Order:  Diet Heart Room service appropriate? Yes; Fluid consistency: Thin  Skin:   dry abrasion to elbow and leg  Last BM:   11/26  Height:   Ht Readings from Last 1 Encounters:  06/22/16 6\' 2"  (1.88 m)    Weight:   Wt Readings from Last 1 Encounters:  06/22/16 161 lb (73 kg)  ubw 185# (4  Months ago)  Ideal Body Weight:   86 kg  BMI:  Body mass index is 20.67 kg/m.  Estimated Nutritional Needs:   Kcal:    2190-2409   Protein:   98-105 gr   Fluid:   2.2 liters daily  EDUCATION NEEDS: not appropriate at this time- hallucination?    Royann ShiversLynn Anaya Bovee MS,RD,CSG,LDN Office: [phone] Pager: [phone]

## 2016-06-23 NOTE — Evaluation (Signed)
Physical Therapy Evaluation Patient Details Name: Corey Hartman MRN: [id number] DOB: 23-Oct-1960 Today's Date: 06/23/2016   History of Present Illness  55 y.o. male with a history of hypertension, orthostasis, alcoholism. Patient presents from Dr. [name] office due to failure to thrive and weakness. He was seen there for follow-up of his orthostasis and weakness was noted to have a 30 pound weight loss in the past several months. In questioning the patient, the patient has been drinking 5-6 drinks of liquor every night. His oral food intake has been pretty minimal. He does report chronic diarrhea. His symptoms are worsening. His been noticing increased weakness with presyncopal symptoms that have been increasing. He has been prescribed Midodrin for the orthostasis, but he has not started this yet. His last alcohol was yesterday.  Clinical Impression  Pt received in bed, and is agreeable to PT evaluation.  Unsure if the information he gave me regarding his home set up is correct.  Pt states he uses a quad cane for ambulation at all times, however it was set up for the wrong side.  He expressed that he lives alone, but has a friend that comes 1x/month to assist him with cooking/cleaning.  During PT evaluation, he required Min A for sit<>stand with quad cane, and Min A for gait due to ataxia.  He was incontinent of urine during gait, which demonstrates high fall risk due to risk of slipping in urine.  Pt was assisted with getting cleaned up, and gait was further assessed. He ambulated 154ft with quad cane on the left and HHA on the right.  Pt continues to be ataxic, with poor balance especially during change of direction.  He may also be hallucinating - asking his nurse why the moved all of her stuff downstairs.  He is not oriented to place - pt states that he is currently at home, but later determines that he is at the hospital. At this point he is recommended for 24/7 supervision/assistance at SNF vs  ALF.      Follow Up Recommendations Supervision/Assistance - 24 hour;SNF;Other (comment) (vs ALF)    Equipment Recommendations  Rolling walker with 5" wheels    Recommendations for Other Services       Precautions / Restrictions Precautions Precautions: Fall Precaution Comments: 1 fall - Pt states he passed out due to BG dropping too low.  Restrictions Weight Bearing Restrictions: No      Mobility  Bed Mobility Overal bed mobility: Modified Independent                Transfers Overall transfer level: Needs assistance Equipment used: Quad cane Transfers: Sit to/from Stand Sit to Stand: Min assist         General transfer comment: initial posterior lean. Upon standing, pt expressed need to use the bathroom.  Pt became incontinent on the floor on his way to the bathroom, and then would not sit to finish urinating.  Assist for gown & sock change afterwards.   Ambulation/Gait Ambulation/Gait assistance: Min assist;Mod assist Ambulation Distance (Feet): 150 Feet Assistive device: Quad cane;1 person hand held assist Gait Pattern/deviations: Step-through pattern;Ataxic;Wide base of support   Gait velocity interpretation: <1.8 ft/sec, indicative of risk for recurrent falls General Gait Details: Quad cane on the left, and HHA on the right.  Pt may benefit from RW due to need for B UE support.  Pt demonstrates increased L sided lean and LOB during ambulation, but improved when PT stood on that side. Poor balance with turns  and required increased time  Stairs            Wheelchair Mobility    Modified Rankin (Stroke Patients Only)       Balance Overall balance assessment: History of Falls;Needs assistance Sitting-balance support: Bilateral upper extremity supported;Feet supported Sitting balance-Leahy Scale: Fair   Postural control: Posterior lean;Left lateral lean Standing balance support: Bilateral upper extremity supported Standing balance-Leahy Scale:  Poor                               Pertinent Vitals/Pain Pain Assessment: No/denies pain    Home Living   Living Arrangements: Alone Available Help at Discharge: Friend(s);Available PRN/intermittently [name] - helps with cooking, cleaning.  Comes 1x per month) Type of Home: House Home Access: Stairs to enter   Entergy CorporationEntrance Stairs-Number of Steps: through the basement 1/2 flight.  Home Layout: Two level;Bed/bath upstairs Home Equipment: Cane - quad      Prior Function Level of Independence: Independent with assistive device(s)   Gait / Transfers Assistance Needed: Pt uses quad cane for ambulation.   ADL's / Homemaking Assistance Needed: independent with dressing and bathing.  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          Hand Dominance   Dominant Hand: Left    Extremity/Trunk Assessment   Upper Extremity Assessment: Overall WFL for tasks assessed           Lower Extremity Assessment: Generalized weakness         Communication      Cognition Arousal/Alertness: Awake/alert Behavior During Therapy: Flat affect Overall Cognitive Status: Impaired/Different from baseline Area of Impairment: Orientation Orientation Level: Disoriented to;Place (states that he is at his house.  )             General Comments: Pt possibly hallucinating - he looked over in the corner and stated "where she at" as if he was speaking to someone.  While ambulating in the hallway, pt asked his nurse, [place]ittany, why they moved all her stuff down to the second floor.      General Comments      Exercises     Assessment/Plan    PT Assessment Patient needs continued PT services  PT Problem List Decreased strength;Decreased activity tolerance;Decreased balance;Decreased mobility;Decreased coordination;Decreased cognition;Decreased knowledge of use of DME;Decreased safety awareness;Decreased knowledge of  precautions          PT Treatment Interventions DME instruction;Gait training;Functional mobility training;Therapeutic activities;Therapeutic exercise;Balance training;Patient/family education;Cognitive remediation    PT Goals (Current goals can be found in the Care Plan section)  Acute Rehab PT Goals Patient Stated Goal: 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  PT Goal Formulation: With patient Time For Goal Achievement: 06/30/16 Potential to Achieve Goals: Fair    Frequency Min 3X/week   Barriers to discharge Decreased caregiver support      Co-evaluation               End of Session Equipment Utilized During Treatment: Gait belt Activity Tolerance: Patient tolerated treatment well Patient left: in bed;with call bell/phone within reach;with bed alarm set Nurse Communication: Mobility status Lowanda Foster(Brittany, RN notified of pt's mobiltiy)         Time: 4098-1191: 1530-1608 PT Time Calculation (min) (ACUTE ONLY): 38 min   Charges:   PT Evaluation $PT Eval Moderate Complexity: 1 Procedure  PT Treatments $Gait Training: 8-22 mins $Therapeutic Activity: 8-22 mins   PT G Codes:        Beth Hason Ofarrell, PT, DPT X: [phone]

## 2016-06-23 NOTE — Progress Notes (Signed)
Pt has attempted elopement and is frequently getting in and out of the bed. He is going through withdrawals. Called MD and got a Recruitment consultantsafety sitter ordered. Will continue to monitor.

## 2016-06-23 NOTE — Progress Notes (Signed)
PROGRESS NOTE    Corey Hartman  ZOX:[id number] DOB: 03-10-1961 DOA: 06/22/2016 PCP: Lenoria Chime, FNP    Brief Narrative:  55 year old male with a history of hypertension, orthostasis, and alcoholism, presents from Dr. [name] office with complaints of failure to thrive and weakness. While in the ED, Patient was noted to be hypokalemic and hypomagnesemic. Patient has elevated LFTs and anemia that are secondary to alcoholism. Patient has been started on CIWA protocol, potassium supplementation, magnesium supplementation, and admitted for further evaluation.   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 Failure to thrive. Nutrition has been consulted.  2. Hepatic lesion. Abdominal US revealed a 2.3 x 2.1 x 2.2 cm right hepatic hypoechoic solid-appearing focus Follow-up with PCP as an outpatient.  3. Vertigo.  4. Hypokalemia. Replace.  5. Hypomagnesemia. Replace.  6. Elevated LFTs. Likely alcohol induced. [place] to evaluate for cirrhosis.  7. Alcohol abuse. CIWA protocol. He is beginning to go through withdrawal. Suspect he may get worse before getting better.  Continue with Benzo. 8. Anemia secondary to alcoholism.  9. Orthostasis. Continue midodrine.  10. HTN. Continue home medications. 11. Malnutrition Nutrition consult.   12. Weakness. PT has been consulted.   DVT prophylaxis: Eliquis  Code Status: Full  Family Communication: Daughters bedside Disposition Plan: Discharge home once improved.    Consultants:   PT  Nutrition   Procedures:   None   Antimicrobials:   None    Subjective: Feels fine. Per daughter he is sweating profusely.   Objective: Vitals:   06/22/16 1754 06/22/16 1947 06/23/16 0015 06/23/16 0638  BP: (!) 148/79  119/73 132/85  Pulse: (!) 108   (!) 105 (!) 119  Resp: 18  20   Temp: 98.6 F (37 C)  98.5 F (36.9 C) 98.5 F (36.9 C)  TempSrc: Oral  Oral Oral  SpO2: 100% 100% 100% 100%  Weight: 73 kg (161 lb)     Height: 6\' 2"  (1.88 m)       Intake/Output Summary (Last 24 hours) at 06/23/16 0819 Last data filed at 06/23/16 0316  Gross per 24 hour  Intake               50 ml  Output              250 ml  Net             -200 ml   Filed Weights   06/22/16 1321 06/22/16 1754  Weight: 74.8 kg (165 lb) 73 kg (161 lb)    Examination:  General exam: Appears calm and comfortable  Respiratory system: Clear to auscultation. Respiratory effort normal. Cardiovascular system: S1 & S2 heard, RRR. No JVD, murmurs, rubs, gallops or clicks. No pedal edema. Gastrointestinal system: Abdomen is nondistended, soft and nontender. No organomegaly or masses felt. Normal bowel sounds heard. Central nervous system: Alert and oriented. No focal neurological deficits. Extremities: Symmetric 5 x 5 power. Skin: No rashes, lesions or ulcers Psychiatry: Judgement and insight appear normal. Mood & affect appropriate.     Data Reviewed: I have personally reviewed following labs and imaging studies   Recent Labs Lab 06/22/16 1418  WBC 5.8  HGB 8.5*  HCT 24.5*  MCV 84.8  PLT 145*  Recent Labs Lab 06/22/16 1418 06/23/16 0522  NA 133* 133*  K 2.8* 3.0*  CL 85* 92*  CO2 29 30  GLUCOSE 113* 110*  BUN 12 14  CREATININE 1.14 0.86  CALCIUM 9.1 8.4*  MG 0.9* 1.3*    Recent Labs Lab 06/22/16 1418  AST 114*  ALT 27  ALKPHOS 140*  BILITOT 2.6*  PROT 7.1  ALBUMIN 3.5    Recent Labs Lab 06/22/16 1353  GLUCAP 99    Recent Labs  06/22/16 1418  TSH 1.934    Recent Labs  06/22/16 1415  VITAMINB12 660  FERRITIN 2,671*  TIBC 165*  IRON 144    Radiology Studies: Ct Head Wo Contrast  Result Date: 06/22/2016 CLINICAL DATA:  Frequent falls, alcohol abuse, dizziness, memory issues EXAM: CT HEAD WITHOUT CONTRAST  TECHNIQUE: Contiguous axial images were obtained from the base of the skull through the vertex without intravenous contrast. COMPARISON:  None. FINDINGS: Brain: Diffuse brain atrophy evident with minor chronic white matter microvascular ischemic changes throughout the cerebral hemispheres. No acute intracranial hemorrhage, mass lesion, definite infarction, focal edema, mass effect, midline shift, herniation, hydrocephalus, or extra-axial fluid collection. Cisterns remain patent. Cerebellar atrophy as well. Vascular: No hyperdense vessel or unexpected calcification. Skull: Normal. Negative for fracture or focal lesion. Sinuses/Orbits: No acute finding. Other: None. IMPRESSION: Brain atrophy and mild chronic white matter microvascular ischemic changes. No acute intracranial finding by noncontrast CT. Electronically Signed   By: Judie PetitM.  Shick M.D.   On: 06/22/2016 17:00   [place] Abdomen Limited Ruq  Result Date: 06/22/2016 CLINICAL DATA:  Elevated liver function tests, unspecified disorder of liver function. EXAM: US ABDOMEN LIMITED - RIGHT UPPER QUADRANT COMPARISON:  None. FINDINGS: Gallbladder: The gallbladder is distended in appearance without wall thickening. No sonographic Eulah PontMurphy sign was elicited by the technologist. There is a moderate amount of biliary sludge contained within the gallbladder. Common bile duct: Diameter: 4 mm. Liver: Diffusely echogenic consistent with fatty infiltration. There is an approximately 2.3 x 2.1 x 2.2 cm hypoechoic focus relative to surrounding liver parenchyma. Although findings could potentially represent focal fatty sparing, a liver lesion is not entirely excluded. IMPRESSION: 1. Fatty infiltration of the liver. There is a 2.3 x 2.1 x 2.2 cm right hepatic hypoechoic solid-appearing focus which may simply reflect an area of focal fatty sparing. A hepatic lesion however is not entirely excluded. Non-emergent CT or MRI without and with IV contrast may help for further correlation if not  documented elsewhere. 2. Tumefactive biliary sludge. No sonographic findings of cholecystitis. Electronically Signed   By: Tollie Ethavid  Kwon M.D.   On: 06/22/2016 17:51   Scheduled Meds: . apixaban  5 mg Oral BID  . fenofibrate  54 mg Oral Daily  . folic acid  1 mg Oral Daily  . hydrochlorothiazide  25 mg Oral Daily  . LORazepam  0-4 mg Intravenous Q6H   Followed by  . [START ON 06/24/2016] LORazepam  0-4 mg Intravenous Q12H  . magnesium oxide  800 mg Oral BID  . multivitamin with minerals  1 tablet Oral Daily  . potassium chloride  60 mEq Oral BID  . ramipril  10 mg Oral Daily  . sodium chloride flush  3 mL Intravenous Q12H  . thiamine  100 mg Oral Daily   Or  . thiamine  100 mg Intravenous Daily  . verapamil  240 mg Oral Daily   Continuous Infusions:  LOS: 1 day   Time spent: 25 minutes    Theron AristaPeter  Conley RollsLe, MD FACP [facility] If 7PM-7AM, please contact night-coverage [email or web address] Password [email or web address] 06/23/2016, 8:19 AM  By signing my name below, I, Cynda AcresHailei Fulton, attest that this documentation has been prepared under the direction and in the presence of Houston SirenPeter Jaythan Hinely, MD. Electronically signed: Cynda AcresHailei Fulton, Scribe.  06/23/16 12:31 PM

## 2016-06-23 NOTE — Progress Notes (Signed)
PT heart rate increase to 140-150s when moving and up with assist. When at rest 110-120. Ativan given per CIWA protocol. Continue to monitor.

## 2016-06-24 ENCOUNTER — Inpatient Hospital Stay (HOSPITAL_COMMUNITY): Payer: BLUE CROSS/BLUE SHIELD

## 2016-06-24 DIAGNOSIS — I82409 Acute embolism and thrombosis of unspecified deep veins of unspecified lower extremity: Secondary | ICD-10-CM | POA: Diagnosis present

## 2016-06-24 DIAGNOSIS — R627 Adult failure to thrive: Principal | ICD-10-CM

## 2016-06-24 DIAGNOSIS — F1010 Alcohol abuse, uncomplicated: Secondary | ICD-10-CM

## 2016-06-24 MED ORDER — HYDRALAZINE HCL 25 MG PO TABS: 25.0000 mg | ORAL_TABLET | Freq: Four times a day (QID) | ORAL | Status: DC | PRN

## 2016-06-24 MED ORDER — IOPAMIDOL (ISOVUE-300) INJECTION 61%
100.0000 mL | Freq: Once | INTRAVENOUS | Status: AC | PRN
  Administered 2016-06-24: 100 mL via INTRAVENOUS

## 2016-06-24 MED ORDER — POTASSIUM CHLORIDE 10 MEQ/100ML IV SOLN
10.0000 meq | INTRAVENOUS | Status: AC
  Administered 2016-06-24 (×3): 10 meq via INTRAVENOUS
  Filled 2016-06-24 (×3): qty 100

## 2016-06-24 NOTE — Clinical Social Work Note (Signed)
CSW noted PT recommendations of possible SNF vs ALF. Pt is in ETOH withdrawal and CIWA is 20 this morning. Will continue to follow and address d/c planning when appropriate.   Derenda FennelKara Zymiere Trostle, LCSW [phone]

## 2016-06-24 NOTE — Progress Notes (Signed)
[facility]  PROGRESS NOTE  Corey CongoMichael Hartman WUJ:[id number] DOB: [birth year] DOA: 06/22/2016 PCP: Lenoria ChimeWhite, Valerie A, FNP   Brief HPI:   *55 year old male with a history of hypertension, orthostasis, and alcoholism, presents from Dr. Ronal Fearoonquah's office with complaints of failure to thrive and weakness. While in the ED, Patient was noted to be hypokalemic and hypomagnesemic. Patient has elevated LFTs and anemia that are secondary to alcoholism. Patient has been started on CIWA protocol, potassium supplementation, magnesium supplementation, and admitted for further evaluation.    Subjective   Patient seen and examined, continues to be lethargic.   Assessment/Plan:     1. Alcohol abuse- no signs and symptoms of alcohol withdrawal, continue CIWA protocol. 2. Hypokalemia-replace potassium and check BMP in a.m. 3. Hepatic lesion- patient has  2.3 x 2.1 x 2.2 cm right hepatic hypoechoic solid-appearing focus. Will obtain CT abdomen pelvis with contrast to rule out underlying malignancy. 4. Failure to thrive- secondary to alcohol abuse, nutrition consulted. Started on [employer] Enlive by mouth twice a day. 5. Orthostasis- continue midodrine. 6. History of DVT-continue apixaban    DVT prophylaxis: Apixaban  Code Status: Full code  Family Communication: Discussed with sister and wife at bedside  Disposition Plan: SNF when Medically stable   Consultants:  None  Procedures:  Abdominal ultrasound  Continuous infusions  none     Antibiotics:   Anti-infectives    None       Objective   Vitals:   06/23/16 0638 06/23/16 1209 06/23/16 2057 06/24/16 0515  BP: 132/85 130/78 133/84 (!) 143/79  Pulse: (!) 119 (!) 123 84 (!) 115  Resp:  20 18 18   Temp: 98.5 F (36.9 C) 98.8 F (37.1 C) 98.1 F (36.7 C) 98 F (36.7 C)  TempSrc: Oral Oral Oral Oral  SpO2: 100% 98% 100% 100%  Weight:      Height:        Intake/Output Summary (Last 24 hours) at 06/24/16 1238 Last data  filed at 06/24/16 0437  Gross per 24 hour  Intake              483 ml  Output             1400 ml  Net             -917 ml   Filed Weights   06/22/16 1321 06/22/16 1754  Weight: 74.8 kg (165 lb) 73 kg (161 lb)     Physical Examination:  General exam: Appears calm and comfortable. Respiratory system: Clear to auscultation. Respiratory effort normal. Cardiovascular system:  RRR. No  murmurs, rubs, gallops. No pedal edema. GI system: Abdomen is nondistended, soft and nontender. No organomegaly.  Central nervous system. No focal neurological deficits. 5 x 5 power in all extremities. Skin: No rashes, lesions or ulcers. Psychiatry: Alert, oriented x 3.Judgement and insight appear normal. Affect normal.    Data Reviewed: I have personally reviewed following labs and imaging studies  CBG:  Recent Labs Lab 06/22/16 1353  GLUCAP 99    CBC:  Recent Labs Lab 06/22/16 1418  WBC 5.8  HGB 8.5*  HCT 24.5*  MCV 84.8  PLT 145*    Basic Metabolic Panel:  Recent Labs Lab 06/22/16 1418 06/23/16 0522  NA 133* 133*  K 2.8* 3.0*  CL 85* 92*  CO2 29 30  GLUCOSE 113* 110*  BUN 12 14  CREATININE 1.14 0.86  CALCIUM 9.1 8.4*  MG 0.9* 1.3*    No results found for  this or any previous visit (from the past 240 hour(s)).   Liver Function Tests:  Recent Labs Lab 06/22/16 1418  AST 114*  ALT 27  ALKPHOS 140*  BILITOT 2.6*  PROT 7.1  ALBUMIN 3.5   No results for input(s): LIPASE, AMYLASE in the last 168 hours. No results for input(s): AMMONIA in the last 168 hours.  Cardiac Enzymes: No results for input(s): CKTOTAL, CKMB, CKMBINDEX, TROPONINI in the last 168 hours. BNP (last 3 results) No results for input(s): BNP in the last 8760 hours.  ProBNP (last 3 results) No results for input(s): PROBNP in the last 8760 hours.    Studies: Ct Head Wo Contrast  Result Date: 06/22/2016 CLINICAL DATA:  Frequent falls, alcohol abuse, dizziness, memory issues EXAM: CT HEAD  WITHOUT CONTRAST TECHNIQUE: Contiguous axial images were obtained from the base of the skull through the vertex without intravenous contrast. COMPARISON:  None. FINDINGS: Brain: Diffuse brain atrophy evident with minor chronic white matter microvascular ischemic changes throughout the cerebral hemispheres. No acute intracranial hemorrhage, mass lesion, definite infarction, focal edema, mass effect, midline shift, herniation, hydrocephalus, or extra-axial fluid collection. Cisterns remain patent. Cerebellar atrophy as well. Vascular: No hyperdense vessel or unexpected calcification. Skull: Normal. Negative for fracture or focal lesion. Sinuses/Orbits: No acute finding. Other: None. IMPRESSION: Brain atrophy and mild chronic white matter microvascular ischemic changes. No acute intracranial finding by noncontrast CT. Electronically Signed   By: Judie Petit.  Shick M.D.   On: 06/22/2016 17:00   US Abdomen Limited Ruq  Result Date: 06/22/2016 CLINICAL DATA:  Elevated liver function tests, unspecified disorder of liver function. EXAM: US ABDOMEN LIMITED - RIGHT UPPER QUADRANT COMPARISON:  None. FINDINGS: Gallbladder: The gallbladder is distended in appearance without wall thickening. No sonographic Eulah Pont sign was elicited by the technologist. There is a moderate amount of biliary sludge contained within the gallbladder. Common bile duct: Diameter: 4 mm. Liver: Diffusely echogenic consistent with fatty infiltration. There is an approximately 2.3 x 2.1 x 2.2 cm hypoechoic focus relative to surrounding liver parenchyma. Although findings could potentially represent focal fatty sparing, a liver lesion is not entirely excluded. IMPRESSION: 1. Fatty infiltration of the liver. There is a 2.3 x 2.1 x 2.2 cm right hepatic hypoechoic solid-appearing focus which may simply reflect an area of focal fatty sparing. A hepatic lesion however is not entirely excluded. Non-emergent CT or MRI without and with IV contrast may help for further  correlation if not documented elsewhere. 2. Tumefactive biliary sludge. No sonographic findings of cholecystitis. Electronically Signed   By: Tollie Eth M.D.   On: 06/22/2016 17:51    Scheduled Meds: . apixaban  5 mg Oral BID  . feeding supplement (ENSURE ENLIVE)  237 mL Oral BID BM  . fenofibrate  54 mg Oral Daily  . folic acid  1 mg Oral Daily  . LORazepam  0-4 mg Intravenous Q4H   Followed by  . [START ON 06/25/2016] LORazepam  0-4 mg Intravenous Q12H  . magnesium oxide  800 mg Oral BID  . multivitamin with minerals  1 tablet Oral Daily  . potassium chloride  10 mEq Intravenous Q1 Hr x 3  . ramipril  10 mg Oral Daily  . sodium chloride flush  3 mL Intravenous Q12H  . thiamine  100 mg Oral Daily   Or  . thiamine  100 mg Intravenous Daily  . verapamil  240 mg Oral Daily      Time spent: 25 min  LAMA,GAGAN S   [facility] Pager [phone]. If 7PM-7AM, please contact night-coverage at [email or web address], Office  [phone]  password [email or web address] 06/24/2016, 12:38 PM  LOS: 2 days

## 2016-06-24 NOTE — Progress Notes (Signed)
Pt has become increasingly anxious and aggressive since 1900 due to ETOH withdrawal (MD was made aware) and Ativan has been given but has been only minimally effective. Pt is now refusing to allow anyone to take his vital signs.

## 2016-06-25 DIAGNOSIS — R5383 Other fatigue: Secondary | ICD-10-CM

## 2016-06-25 DIAGNOSIS — R5381 Other malaise: Secondary | ICD-10-CM

## 2016-06-25 DIAGNOSIS — R531 Weakness: Secondary | ICD-10-CM

## 2016-06-25 DIAGNOSIS — E876 Hypokalemia: Secondary | ICD-10-CM

## 2016-06-25 DIAGNOSIS — N390 Urinary tract infection, site not specified: Secondary | ICD-10-CM

## 2016-06-25 LAB — COMPREHENSIVE METABOLIC PANEL
ALBUMIN: 3.1 g/dL — AB (ref 3.5–5.0)
ALK PHOS: 111 U/L (ref 38–126)
ALT: 52 U/L (ref 17–63)
ANION GAP: 8 (ref 5–15)
AST: 140 U/L — AB (ref 15–41)
BILIRUBIN TOTAL: 0.7 mg/dL (ref 0.3–1.2)
BUN: 7 mg/dL (ref 6–20)
CALCIUM: 10.2 mg/dL (ref 8.9–10.3)
CO2: 32 mmol/L (ref 22–32)
Chloride: 97 mmol/L — ABNORMAL LOW (ref 101–111)
Creatinine, Ser: 0.9 mg/dL (ref 0.61–1.24)
GFR calc Af Amer: >60 mL/min (ref >60)
GFR calc non Af Amer: >60 mL/min (ref >60)
GLUCOSE: 100 mg/dL — AB (ref 65–99)
Potassium: 4.1 mmol/L (ref 3.5–5.1)
SODIUM: 137 mmol/L (ref 135–145)
TOTAL PROTEIN: 6.4 g/dL — AB (ref 6.5–8.1)

## 2016-06-25 MED ORDER — SODIUM CHLORIDE 0.9 % IV SOLN
INTRAVENOUS | Status: DC
  Administered 2016-06-25 – 2016-06-29 (×10): via INTRAVENOUS

## 2016-06-25 MED ORDER — DEXTROSE 5 % IV SOLN
1.0000 g | INTRAVENOUS | Status: DC
  Administered 2016-06-25 – 2016-06-28 (×4): 1 g via INTRAVENOUS
  Filled 2016-06-25 (×5): qty 10

## 2016-06-25 NOTE — Progress Notes (Signed)
Pharmacy Antibiotic Note  Corey Hartman is a 55 y.o. male admitted on 06/22/2016 with UTI.  Pharmacy has been consulted for Ceftriaxone dosing.  Plan: Ceftriaxone 1gm IV q24h F/U cultures and clinical progress De[place]ate tx as indicated  Height: 6\' 2"  (188 cm) Weight: 161 lb (73 kg) IBW/kg (Calculated) : 82.2  Temp (24hrs), Avg:98.5 F (36.9 C), Min:97.9 F (36.6 C), Max:99.1 F (37.3 C)   Recent Labs Lab 06/22/16 1418 06/23/16 0522 06/25/16 0617  WBC 5.8  --   --   CREATININE 1.14 0.86 0.90    Estimated Creatinine Clearance: 98 mL/min (by C-G formula based on SCr of 0.9 mg/dL).    No Known Allergies  Antimicrobials this admission: Ceftriaxone 11/30 >>   Dose adjustments this admission: n/a  Microbiology results: 11/30 UCx: in process   Thank you for allowing pharmacy to be a part of this patient's care.  Corey Hartman, BS Pharm D, [place] Clinical Pharmacist Pager [phone] 06/25/2016 2:14 PM

## 2016-06-25 NOTE — Care Management (Signed)
Patient Information   Patient Name Corey Hartman, Paco ([id number]) Sex Male DOB 11/14/1960  Room Bed  A317 A317-01  Patient Demographics   Address [address] [place] [id number] Phone [phone] (Home)  Patient Ethnicity & Race   Ethnic Group Patient Race  Not Hispanic or Latino Black or African American  Emergency Contact(s)   Name Relation Home Work Mobile  IrondaleHoward,Larita Spouse [phone]  [phone]  Royster,Denia Daughter [phone]  [phone]  Documents on File    Status Date Received Description  Documents for the Patient  [place] HIPAA NOTICE OF PRIVACY - Scanned Not Received    [facility] E-Signature HIPAA Notice of Privacy Signed 06/22/16   Driver's License Not Received  vadl  Insurance Card Not Received  bcbs  Advance Directives/Living Will/HCPOA/POA Not Received    Other Photo ID Not Received    Patient Photo   Photo of Patient  Documents for the Encounter  AOB (Assignment of Insurance Benefits) Not Received    E-signature AOB Signed 06/22/16   MEDICARE RIGHTS Not Received    E-signature Medicare Rights     ED Patient Billing Extract   ED PB Summary  ED Patient Billing Extract   ED Encounter Summary  Ultrasound  06/22/16   Cardiac Monitoring Strip  06/22/16   EKG  06/23/16   Admission Information   Attending Provider Admitting Provider Admission Type Admission Date/Time  Meredeth IdeGagan S Lama, MD Levie HeritageJacob J Stinson, DO Emergency 06/22/16 1328  Discharge Date Hospital Service Auth/Cert Status Service Area   Family Medicine Incomplete [place] SERVICE AREA  Unit Room/Bed Admission Status   AP-DEPT 300 A317/A317-01 Admission (Confirmed)   Admission   Complaint  medical eval  Hospital Account   Name Acct ID Class Status Primary Coverage  Corey Hartman, [place] [id number] Inpatient Open BLUE CROSS BLUE SHIELD - BCBS OTHER      Guarantor Account (for Hospital Account [id number])   Name Relation to Pt Service Area Active? Acct Type  Corey Hartman,  [place] [facility] Yes Personal/Family  Address Phone    [address] [place], [place] [id number] [phone](H)        Coverage Information (for Hospital Account [id number])   F/O Payor/Plan Precert #  [facility]/BCBS OTHER   Subscriber Subscriber #  Corey Hartman, [place] [id number]  Address Phone  [address] [place], [place] [address]

## 2016-06-25 NOTE — Care Management Note (Addendum)
Case Management Note  Patient Details  Name: Corey Hartman MRN: [id number] Date of Birth: Nov 28, 1960  Subjective/Objective:   Patient adm from home with FTT, weakness. Patient currently abuses ETOH and has been on CIWA protocol. Patient states PTA, ind with ADL's. States he currently lives alone. Patient has refused ALF. [facility] contacted for referral. Appears referral has been made by neurology office same day as admission to hospital.                  Action/Plan: Patient plans to return home at time of discharge. Aware of recommendation of having 24 hour supervision and does not wish to endorse this recommendation. Patient is alert and oriented and has capacity to make such decisions. Referral information faxed to [facility]. Will follow for any further needs.   Later entry: Patient still planning on going home at discharge. Patient also recommended for RW or WC by PT. Patient has elected to use RW. Alroy BailiffLinda Lothian [facility] will [facility] notified and obtain order from chart. RW will be delivered to room prior to discharge.   Expected Discharge Date:  06/24/16               Expected Discharge Plan:  Home/Self Care  In-House Referral:  Clinical Social Work  Discharge planning Services  CM Consult  Post Acute Care Choice:  Home Health Choice offered to:  Patient  DME Arranged:    DME Agency:     HH Arranged:  PT [facility]  Status of Service:  In process, will continue to follow  If discussed at Long Length of Stay Meetings, dates discussed:    Additional Comments:  Ghali Morissette, Chrystine OilerSharley Diane, RN 06/25/2016, 2:58 PM

## 2016-06-25 NOTE — Care Management (Signed)
Home Health (Order [id number])  Nursing  Date: 06/25/2016 [facility] Ordering/Authorizing: Meredeth IdeGagan S Lama, MD    Meredeth IdeGagan S Lama, MD NPI: [id number]      Patient Information   Patient Name Corey Hartman, Deverick Sex Male DOB [birth year] SSN [id number]  Order Information   Order Date/Time Release Date/Time Start Date/Time End Date/Time  06/25/16 02:23 PM None 06/25/16 02:23 PM Until Specified  Order History  Inpatient  Date/Time Action Taken User Additional Information  06/25/16 1423 Sign Meredeth IdeGagan S Lama, MD   06/25/16 1423 Release Instance Meredeth IdeGagan S Lama, MD (auto-released) ReleasedOrder:[id number]  06/25/16 1428 Acknowledge Ciro Backerebekah R Brower, RN New Order  Order Questions   Question Answer Comment  To provide the following care/treatments PT       Standing Order Information   Remaining Occurrences Interval Last Released     0/1 At discharge 06/25/2016         Collection Information

## 2016-06-25 NOTE — Clinical Social Work Note (Signed)
Clinical Social Work Assessment  Patient Details  Name: Corey Hartman MRN: [id number] Date of Birth: 12/15/1960  Date of referral:  06/25/16               Reason for consult:  Discharge Planning                Permission sought to share information with:  Family Supports Permission granted to share information::  Yes, Verbal Permission Granted  Name::     Pharmacologist::     Relationship::  daughter  Contact Information:     Housing/Transportation Living arrangements for the past 2 months:  Single Family Home Source of Information:  Patient, Adult Children Patient Interpreter Needed:  None Criminal Activity/Legal Involvement Pertinent to Current Situation/Hospitalization:  No - Comment as needed Significant Relationships:  Adult Children Lives with:  Self Do you feel safe going back to the place where you live?  Yes Need for family participation in patient care:  Yes (Comment)  Care giving concerns:  Pt lives alone. Supervision recommended by PT.    Social Worker assessment / plan:  CSW met with pt at bedside along with a friend with pt's permission. Pt answered questions appropriately about where he was, date, President, and living situation. He states he was working at [employer] in [place] and has been on short term disability since May. He also indicates that he and his wife are separated. She is coming to hospital today and has still been visiting pt. He said that he is independent with ADLs at baseline and uses a cane to ambulate. Discussed PT recommendations for supervision or ALF. Pt indicates he may have a nephew who could stay with him for awhile but he is not at all interested in ALF. CSW discussed ETOH use and pt said he drinks 6-7 shots of whiskey daily. He does not feel that his drinking is a problem for him, although he admits that others do feel it is a problem. Pt not interested in any treatment options at this time and does not want to further discuss. He gave permission  for CSW to speak with his daughter, Corey Hartman. Corey Hartman states pt is "very private." They feel he has been having more issues recently and are currently trying to sort through these, particularly financial so they can help him. Pt has worked at [employer] for 25 years. Family plans to talk together soon to come up with a plan for pt.   Update: Pt's wife now at bedside. Discussed plan with her in pt's presence with his permission. She is very concerned about pt's ability to return home. She asked about ALF, SNF, and ETOH rehab. CSW shared that per MD, pt has capacity and he has chosen to return home. She is aware that pt does not meet skilled criteria and refused ALF. Pt's wife has convinced pt to at least talk to [facility] at [facility] regarding possible ETOH rehab. CSW had told pt, daughter, and wife that plan was for d/c today. However, MD has decided to keep pt in hospital due to UTI. Pt and wife notified that pt will not d/c today.   Employment status:  Other (Comment) (on short term disability) Insurance information:  Managed Care PT Recommendations:  24 Hour Supervision Information / Referral to community resources:  Other (Comment Required) (ALF list)  Patient/Family's Response to care:  Pt is refusing placement and states he will return home.   Patient/Family's Understanding of and Emotional Response to Diagnosis, Current Treatment, and  Prognosis:  Family concerned about pt returning home alone. They plan to actively be working on other options for pt when appropriate. At this time, pt states he will return home.     Emotional Assessment Appearance:  Developmentally appropriate Attitude/Demeanor/Rapport:  Other (Cooperative) Affect (typically observed):  Appropriate Orientation:  Oriented to Self, Oriented to Place, Oriented to  Time Alcohol / Substance use:  Not Applicable Psych involvement (Current and /or in the community):  No (Comment)  Discharge Needs  Concerns to be addressed:  Discharge  Planning Concerns Readmission within the last 30 days:  No Current discharge risk:  Lives alone Barriers to Discharge:  No Barriers Identified   Corey Hartman, [place] 06/25/2016, 1:51 PM [phone]

## 2016-06-25 NOTE — Progress Notes (Signed)
[facility]  PROGRESS NOTE  Corey Hartman ZOX:[id number] DOB: [birth year] DOA: 06/22/2016 PCP: Lenoria ChimeWhite, Valerie A, FNP   Brief HPI:   *55 year old male with a history of hypertension, orthostasis, and alcoholism, presents from Dr. Ronal Fearoonquah's office with complaints of failure to thrive and weakness. While in the ED, Patient was noted to be hypokalemic and hypomagnesemic. Patient has elevated LFTs and anemia that are secondary to alcoholism. Patient has been started on CIWA protocol, potassium supplementation, magnesium supplementation, and admitted for further evaluation.    Subjective   Patient seen and examined, continues to be lethargic.   Assessment/Plan:     1. Alcohol abuse- no signs and symptoms of alcohol withdrawal, continue CIWA protocol. 2. UTI- ua was abnormal on 06/22/16, CT abdomen showed prominence of right renal pelvis. Will start Ceftriaxone per pharmacy consultation. 3. Hypokalemia-replaced potassium  4. Hepatic lesion- patient has  2.3 x 2.1 x 2.2 cm right hepatic hypoechoic solid-appearing focus. We obtained  CT abdomen pelvis with contrast which showed Severe diffuse hepatic steatosis.Focus of fatty sparing RIGHT hepatic lobe response to the ultrasound abnormality.  Mild enhancement of the urothelium of the RIGHT renal pelvis appear recommend correlation for urinary tract infection 5. Failure to thrive- secondary to alcohol abuse, nutrition consulted. Started on [employer] by mouth twice a day. 6. Orthostasis- continue midodrine. 7. History of DVT-continue apixaban    DVT prophylaxis: Apixaban  Code Status: Full code  Family Communication: Discussed with sister and wife at bedside  Disposition Plan: Home when medically stable.   Consultants:  None  Procedures:  Abdominal ultrasound  Continuous infusions  none     Antibiotics:   Anti-infectives    None       Objective   Vitals:   06/24/16 0515 06/24/16 1946 06/24/16 2024 06/25/16  1300  BP: (!) 143/79  110/76 (!) 83/55  Pulse: (!) 115  (!) 101 (!) 111  Resp: 18  20 20   Temp: 98 F (36.7 C)  97.9 F (36.6 C) 99.1 F (37.3 C)  TempSrc: Oral  Oral Oral  SpO2: 100% 96% 100% 100%  Weight:      Height:        Intake/Output Summary (Last 24 hours) at 06/25/16 1408 Last data filed at 06/25/16 1300  Gross per 24 hour  Intake              943 ml  Output             1000 ml  Net              -57 ml   Filed Weights   06/22/16 1321 06/22/16 1754  Weight: 74.8 kg (165 lb) 73 kg (161 lb)     Physical Examination:  General exam: Appears calm and comfortable. Respiratory system: Clear to auscultation. Respiratory effort normal. Cardiovascular system:  RRR. No  murmurs, rubs, gallops. No pedal edema. GI system: Abdomen is nondistended, soft and nontender. No organomegaly.  Central nervous system. No focal neurological deficits. 5 x 5 power in all extremities. Skin: No rashes, lesions or ulcers. Psychiatry: Alert, oriented x 3.Judgement and insight appear normal. Affect normal.    Data Reviewed: I have personally reviewed following labs and imaging studies  CBG:  Recent Labs Lab 06/22/16 1353  GLUCAP 99    CBC:  Recent Labs Lab 06/22/16 1418  WBC 5.8  HGB 8.5*  HCT 24.5*  MCV 84.8  PLT 145*    Basic Metabolic Panel:  Recent Labs Lab  06/22/16 1418 06/23/16 0522 06/25/16 0617  NA 133* 133* 137  K 2.8* 3.0* 4.1  CL 85* 92* 97*  CO2 29 30 32  GLUCOSE 113* 110* 100*  BUN 12 14 7   CREATININE 1.14 0.86 0.90  CALCIUM 9.1 8.4* 10.2  MG 0.9* 1.3*  --     No results found for this or any previous visit (from the past 240 hour(s)).   Liver Function Tests:  Recent Labs Lab 06/22/16 1418 06/25/16 0617  AST 114* 140*  ALT 27 52  ALKPHOS 140* 111  BILITOT 2.6* 0.7  PROT 7.1 6.4*  ALBUMIN 3.5 3.1*   No results for input(s): LIPASE, AMYLASE in the last 168 hours. No results for input(s): AMMONIA in the last 168 hours.  Cardiac  Enzymes: No results for input(s): CKTOTAL, CKMB, CKMBINDEX, TROPONINI in the last 168 hours. BNP (last 3 results) No results for input(s): BNP in the last 8760 hours.  ProBNP (last 3 results) No results for input(s): PROBNP in the last 8760 hours.    Studies: Ct Abdomen Pelvis W Wo Contrast  Result Date: 06/25/2016 CLINICAL DATA:  Hepatic lesion, failure to thrive, hypokalemia. Pt was slow to follow commands, would not hold still. Moved his arms during arterial scan. EXAM: CT ABDOMEN AND PELVIS WITHOUT AND WITH CONTRAST TECHNIQUE: Multidetector CT imaging of the abdomen and pelvis was performed following the standard protocol before and following the bolus administration of intravenous contrast. CONTRAST:  ISOVUE-300 IOPAMIDOL (ISOVUE-300) INJECTION 61% COMPARISON:  Ultrasound 06/22/2016. FINDINGS: Lower chest: Lung bases are clear. Hepatobiliary: Noncontrast exam demonstrates diffuse low-attenuation within liver consistent with severe hepatic steatosis. There is 1 rounded lesion of fatty sparing in the posterior RIGHT hepatic lobe measuring 20 mm x 19 mm which corresponds to the ultrasound abnormality. This lesion demonstrates mild post contrast enhancement which is typical of fatty sparing. No duct dilatation. Sludge in the gallbladder noted. No gallbladder inflammation identified. Common bile duct normal caliber. Pancreas: Pancreas is normal. No ductal dilatation. No pancreatic inflammation. Spleen: Normal spleen Adrenals/urinary tract: Adrenal glands are normal. Elongated nonobstructing calculus in the RIGHT kidney measuring 8 mm. There is mild pelvic caliectasis on the RIGHT. The proximal RIGHT ureters are normal caliber. 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 There is mild enhancement of the uroepithelial within the RIGHT renal pelvis (image 44, series 13) Stomach/Bowel: Stomach and limited view of the bowel is unremarkable. Vascular/Lymphatic: Malleolar thin caliber. No retroperitoneal  adenopathy. Other: No free fluid. Musculoskeletal: No aggressive osseous lesion. IMPRESSION: 1. Severe diffuse hepatic steatosis. 2. Focus of fatty sparing RIGHT hepatic lobe response to the ultrasound abnormality. 3. Mild enhancement of the urothelium of the RIGHT renal pelvis appear recommend correlation for urinary tract infection Electronically Signed   By: Genevive Bi M.D.   On: 06/25/2016 09:42    Scheduled Meds: . apixaban  5 mg Oral BID  . feeding supplement (ENSURE ENLIVE)  237 mL Oral BID BM  . fenofibrate  54 mg Oral Daily  . folic acid  1 mg Oral Daily  . LORazepam  0-4 mg Intravenous Q12H  . magnesium oxide  800 mg Oral BID  . multivitamin with minerals  1 tablet Oral Daily  . ramipril  10 mg Oral Daily  . sodium chloride flush  3 mL Intravenous Q12H  . thiamine  100 mg Oral Daily   Or  . thiamine  100 mg Intravenous Daily  . verapamil  240 mg Oral Daily      Time spent:  25 min  Alesa Echevarria S   [facility] Pager [phone]. If 7PM-7AM, please contact night-coverage at [email or web address], Office  [phone]  password [email or web address] 06/25/2016, 2:08 PM  LOS: 3 days

## 2016-06-25 NOTE — Progress Notes (Signed)
Physical Therapy Treatment Patient Details Name: Corey Hartman MRN: [id number] DOB: Jul 29, 1960 Today's Date: 06/25/2016    History of Present Illness 55 y.o. male with a history of hypertension, orthostasis, alcoholism. Patient presents from Dr. Ronal Fearoonquah's office due to failure to thrive and weakness. He was seen there for follow-up of his orthostasis and weakness was noted to have a 30 pound weight loss in the past several months. In questioning the patient, the patient has been drinking 5-6 drinks of liquor every night. His oral food intake has been pretty minimal. He does report chronic diarrhea. His symptoms are worsening. His been noticing increased weakness with presyncopal symptoms that have been increasing. He has been prescribed Midodrin for the orthostasis, but he has not started this yet. His last alcohol was yesterday.    PT Comments    Pt received in bed, and was agreeable to PT tx.  Pt demonstrates bed mobility at Mod (I) but requires increased time.  He requires Min A for sit<>stand transfers due to initial posterior lean.  Pt was able to ambulate 1550ft with RW and Min/Mod A due to pt requiring constant assistance to stay closer to the RW, and slight assistance to maintain forward progression of gait.  He continues to demonstrate poor safety awareness, as well as poor posture and endurance.  He is recommended for ALF with 24/7 supervision/assistance as well as HHPT.     Follow Up Recommendations  Supervision/Assistance - 24 hour;Other (comment)     Equipment Recommendations  Rolling walker with 5" wheels;Wheelchair (measurements PT)    Recommendations for Other Services       Precautions / Restrictions Precautions Precautions: Fall Precaution Comments: 1 fall - Pt states he passed out due to BG dropping too low.  Restrictions Weight Bearing Restrictions: No    Mobility  Bed Mobility Overal bed mobility: Modified Independent                Transfers Overall  transfer level: Needs assistance Equipment used: Rolling walker (2 wheeled) Transfers: Sit to/from Stand Sit to Stand: Min assist         General transfer comment: Pt continues to demonstrate initial posterior lean  Ambulation/Gait Ambulation/Gait assistance: Min assist;Mod assist Ambulation Distance (Feet): 150 Feet Assistive device: Rolling walker (2 wheeled) Gait Pattern/deviations: Shuffle;Trunk flexed;Wide base of support   Gait velocity interpretation: <1.8 ft/sec, indicative of risk for recurrent falls General Gait Details: Pt requires multiple vc's to keep RW closer to his base of support.  Pt expressed he was fatigued at the end of gait.  Pt requires multiple cues for upright standing posture - he was able to improve, but not sustain.   Stairs            Wheelchair Mobility    Modified Rankin (Stroke Patients Only)       Balance Overall balance assessment: History of Falls;Needs assistance Sitting-balance support: Feet supported Sitting balance-Leahy Scale: Fair     Standing balance support: Bilateral upper extremity supported Standing balance-Leahy Scale: Poor                      Cognition Arousal/Alertness: Awake/alert Behavior During Therapy: WFL for tasks assessed/performed Overall Cognitive Status: Within Functional Limits for tasks assessed                      Exercises      General Comments        Pertinent Vitals/Pain Pain Assessment: No/denies pain  Home Living                      Prior Function            PT Goals (current goals can now be found in the care plan section) Acute Rehab PT Goals Patient Stated Goal: 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  PT Goal Formulation: With patient Time For Goal Achievement: 06/30/16 Potential to Achieve Goals: Fair Progress towards PT goals: Progressing toward goals    Frequency    Min 3X/week      PT Plan Current plan remains appropriate     Co-evaluation             End of Session Equipment Utilized During Treatment: Gait belt Activity Tolerance: Patient tolerated treatment well Patient left: in bed;with call bell/phone within reach;with bed alarm set;with family/visitor present     Time: 1111-1135 PT Time Calculation (min) (ACUTE ONLY): 24 min  Charges:  $Gait Training: 23-37 mins                    G Codes:      Beth Naomee Nowland, PT, DPT X: [phone]

## 2016-06-26 DIAGNOSIS — I10 Essential (primary) hypertension: Secondary | ICD-10-CM

## 2016-06-26 DIAGNOSIS — R7989 Other specified abnormal findings of blood chemistry: Secondary | ICD-10-CM

## 2016-06-26 MED ORDER — LORAZEPAM 2 MG/ML IJ SOLN
2.0000 mg | Freq: Four times a day (QID) | INTRAMUSCULAR | Status: DC | PRN
  Administered 2016-06-26 – 2016-06-30 (×4): 2 mg via INTRAVENOUS
  Filled 2016-06-26 (×4): qty 1

## 2016-06-26 NOTE — Progress Notes (Signed)
[facility]  PROGRESS NOTE  Corey Hartman ZOX:[id number] DOB: [birth year] DOA: 06/22/2016 PCP: Lenoria Chime, FNP   Brief HPI:   *55 year old male with a history of hypertension, orthostasis, and alcoholism, presents from Dr. [name] office with complaints of failure to thrive and weakness. While in the ED, Patient was noted to be hypokalemic and hypomagnesemic. Patient has elevated LFTs and anemia that are secondary to alcoholism. Patient has been started on CIWA protocol, potassium supplementation, magnesium supplementation, and admitted for further evaluation.    Subjective   Patient seen and examined, More alert this morning. Patient says that he is an month [place] and lives across the street, was able to tell me correct month and year, December 2017.    Assessment/Plan:     1. Alcohol abuse- no signs and symptoms of alcohol withdrawal, continue CIWA protocol. 2. UTI- ua was abnormal on 06/22/16, CT abdomen showed prominence of right renal pelvis.  Ceftriaxone per pharmacy consultation.Follow urine culture results 3. Hypokalemia-replaced potassium  4. Hepatic lesion- patient has  2.3 x 2.1 x 2.2 cm right hepatic hypoechoic solid-appearing focus. We obtained  CT abdomen pelvis with contrast which showed Severe diffuse hepatic steatosis.Focus of fatty sparing RIGHT hepatic lobe response to the ultrasound abnormality.  enhancement of the urothelium of the RIGHT renal pelvis appear recommend correlation for urinary tract infection 5. Failure to thrive- secondary to alcohol abuse, nutrition consulted. Started on [employer] by mouth twice a day. 6. Orthostasis- continue midodrine. 7. History of DVT-continue apixaban    DVT prophylaxis: Apixaban  Code Status: Full code  Family Communication: Discussed with sister and wife at bedside on 06/24/2016  Disposition Plan: Home when medically stable.   Consultants:  None  Procedures:  Abdominal  ultrasound  Continuous infusions . sodium chloride 125 mL/hr at 06/26/16 0651   none     Antibiotics:   Anti-infectives    Start     Dose/Rate Route Frequency Ordered Stop   06/25/16 1430  cefTRIAXone (ROCEPHIN) 1 g in dextrose 5 % 50 mL IVPB     1 g 100 mL/hr over 30 Minutes Intravenous Every 24 hours 06/25/16 1413         Objective   Vitals:   06/24/16 2024 06/25/16 1300 06/25/16 2208 06/26/16 0645  BP: 110/76 (!) 83/55 100/60 111/65  Pulse: (!) 101 (!) 111 95 98  Resp: 20 20 17 16   Temp: 97.9 F (36.6 C) 99.1 F (37.3 C)  98 F (36.7 C)  TempSrc: Oral Oral Oral Oral  SpO2: 100% 100% 100% 97%  Weight:      Height:        Intake/Output Summary (Last 24 hours) at 06/26/16 1244 Last data filed at 06/26/16 0855  Gross per 24 hour  Intake          2001.67 ml  Output              300 ml  Net          1701.67 ml   Filed Weights   06/22/16 1321 06/22/16 1754  Weight: 74.8 kg (165 lb) 73 kg (161 lb)     Physical Examination:  General exam: Appears calm and comfortable. Respiratory system: Clear to auscultation. Respiratory effort normal. Cardiovascular system:  RRR. No  murmurs, rubs, gallops. No pedal edema. GI system: Abdomen is nondistended, soft and nontender. No organomegaly.  Central nervous system. No focal neurological deficits. 5 x 5 power in all extremities. Skin: No rashes, lesions or  ulcers. Psychiatry: Alert, oriented x 2.Patient lacks judgment and insight to make complex decision medical decisions at this time    Data Reviewed: I have personally reviewed following labs and imaging studies  CBG:  Recent Labs Lab 06/22/16 1353  GLUCAP 99    CBC:  Recent Labs Lab 06/22/16 1418  WBC 5.8  HGB 8.5*  HCT 24.5*  MCV 84.8  PLT 145*    Basic Metabolic Panel:  Recent Labs Lab 06/22/16 1418 06/23/16 0522 06/25/16 0617  NA 133* 133* 137  K 2.8* 3.0* 4.1  CL 85* 92* 97*  CO2 29 30 32  GLUCOSE 113* 110* 100*  BUN 12 14 7    CREATININE 1.14 0.86 0.90  CALCIUM 9.1 8.4* 10.2  MG 0.9* 1.3*  --     No results found for this or any previous visit (from the past 240 hour(s)).   Liver Function Tests:  Recent Labs Lab 06/22/16 1418 06/25/16 0617  AST 114* 140*  ALT 27 52  ALKPHOS 140* 111  BILITOT 2.6* 0.7  PROT 7.1 6.4*  ALBUMIN 3.5 3.1*   No results for input(s): LIPASE, AMYLASE in the last 168 hours. No results for input(s): AMMONIA in the last 168 hours.  Cardiac Enzymes: No results for input(s): CKTOTAL, CKMB, CKMBINDEX, TROPONINI in the last 168 hours. BNP (last 3 results) No results for input(s): BNP in the last 8760 hours.  ProBNP (last 3 results) No results for input(s): PROBNP in the last 8760 hours.    Studies: Ct Abdomen Pelvis W Wo Contrast  Result Date: 06/25/2016 CLINICAL DATA:  Hepatic lesion, failure to thrive, hypokalemia. Pt was slow to follow commands, would not hold still. Moved his arms during arterial scan. EXAM: CT ABDOMEN AND PELVIS WITHOUT AND WITH CONTRAST TECHNIQUE: Multidetector CT imaging of the abdomen and pelvis was performed following the standard protocol before and following the bolus administration of intravenous contrast. CONTRAST:  100mL ISOVUE-300 IOPAMIDOL (ISOVUE-300) INJECTION 61% COMPARISON:  Ultrasound 06/22/2016. FINDINGS: Lower chest: Lung bases are clear. Hepatobiliary: Noncontrast exam demonstrates diffuse low-attenuation within liver consistent with severe hepatic steatosis. There is 1 rounded lesion of fatty sparing in the posterior RIGHT hepatic lobe measuring 20 mm x 19 mm which corresponds to the ultrasound abnormality. This lesion demonstrates mild post contrast enhancement which is typical of fatty sparing. No duct dilatation. Sludge in the gallbladder noted. No gallbladder inflammation identified. Common bile duct normal caliber. Pancreas: Pancreas is normal. No ductal dilatation. No pancreatic inflammation. Spleen: Normal spleen Adrenals/urinary  tract: Adrenal glands are normal. Elongated nonobstructing calculus in the RIGHT kidney measuring 8 mm. There is mild pelvic caliectasis on the RIGHT. The proximal RIGHT ureters are normal caliber. 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 There is mild enhancement of the uroepithelial within the RIGHT renal pelvis (image 44, series 13) Stomach/Bowel: Stomach and limited view of the bowel is unremarkable. Vascular/Lymphatic: Malleolar thin caliber. No retroperitoneal adenopathy. Other: No free fluid. Musculoskeletal: No aggressive osseous lesion. IMPRESSION: 1. Severe diffuse hepatic steatosis. 2. Focus of fatty sparing RIGHT hepatic lobe response to the ultrasound abnormality. 3. Mild enhancement of the urothelium of the RIGHT renal pelvis appear recommend correlation for urinary tract infection Electronically Signed   By: Genevive BiStewart  Edmunds M.D.   On: 06/25/2016 09:42    Scheduled Meds: . apixaban  5 mg Oral BID  . cefTRIAXone (ROCEPHIN)  IV  1 g Intravenous Q24H  . feeding supplement (ENSURE ENLIVE)  237 mL Oral BID BM  . fenofibrate  54 mg  Oral Daily  . folic acid  1 mg Oral Daily  . LORazepam  0-4 mg Intravenous Q12H  . magnesium oxide  800 mg Oral BID  . multivitamin with minerals  1 tablet Oral Daily  . sodium chloride flush  3 mL Intravenous Q12H  . thiamine  100 mg Oral Daily   Or  . thiamine  100 mg Intravenous Daily      Time spent: 25 min  [address] [facility] LLC[facility],Eevie Lapp S   [facility] Pager [phone]. If 7PM-7AM, please contact night-coverage at [email or web address], Office  [phone]  password [email or web address] 06/26/2016, 12:44 PM  LOS: 4 days

## 2016-06-26 NOTE — Clinical Social Work Note (Addendum)
Pt's daughter, Audree CamelDenia called CSW very concerned about pt returning home. CSW shared that this would be discussed with MD today and as long as pt has capacity, it would be his decision. Pt's daughter Marcelino DusterMichelle also called with same concerns. MD feels today that pt does not have enough insight to have capacity. Pt requests that CSW speak with Marcelino DusterMichelle for decisions as pt is separated from wife, although this is not legal. Marcelino DusterMichelle requests that CS[place] and [place]. If this is preference, she is aware to provide a list of options for CSW to look into. She understands that this is private pay. Marcelino DusterMichelle also plans to contact pt's EAP regarding ETOH rehab to see if he could qualify as this would be their preference.   Derenda FennelKara Rashon Westrup, LCSW [phone]

## 2016-06-26 NOTE — NC FL2 (Signed)
[place] MEDICAID FL2 LEVEL OF CARE SCREENING TOOL     IDENTIFICATION  Patient Name: Corey CongoMichael Munger Birthdate: 11/16/1960 Sex: male Admission Date (Current Location): 06/22/2016  [facility] [place]  [place]   Facility and Address:  [facility],  [address], [place] [id number]      Provider Number: [id number]  Attending Physician Name and Address:  Meredeth IdeGagan S Lama, MD  Relative Name and Phone Number:       Current Level of Care: Hospital Recommended Level of Care: Assisted Living Facility Prior Approval Number:    Date Approved/Denied:   PASRR Number:    Discharge Plan: Other (Comment) (ALF/FCH)    Current Diagnoses: Patient Active Problem List   Diagnosis Date Noted  . UTI (urinary tract infection) 06/25/2016  . DVT (deep venous thrombosis) (HCC) 06/24/2016  . Alcohol abuse 06/22/2016  . Failure to thrive in adult 06/22/2016  . Weakness 06/22/2016  . 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 Elevated LFTs 06/22/2016  . Essential hypertension 06/22/2016  . Hypokalemia 06/22/2016  . Hypomagnesemia 06/22/2016  . Orthostasis 06/22/2016    Orientation RESPIRATION BLADDER Height & Weight     Self, Time, Place  Normal Incontinent Weight: 161 lb (73 kg) Height:  6\' 2"  (188 cm)  BEHAVIORAL SYMPTOMS/MOOD NEUROLOGICAL BOWEL NUTRITION STATUS  Other (Comment) (none)  (n/a) Incontinent Diet (Heart healthy)  AMBULATORY STATUS COMMUNICATION OF NEEDS Skin   Limited Assist Verbally Skin abrasions, Bruising                       Personal Care Assistance Level of Assistance  Bathing, Feeding, Dressing Bathing Assistance: Limited assistance Feeding assistance: Limited assistance Dressing Assistance: Limited assistance     Functional Limitations Info  Sight, Hearing, Speech Sight Info: Impaired Hearing Info: Adequate Speech Info: Adequate    SPECIAL CARE FACTORS FREQUENCY  PT (By licensed PT)                     Contractures Contractures Info: Not present    Additional Factors Info  Psychotropic Code Status Info: Full code Allergies Info: No known allergies Psychotropic Info: Ativan         Current Medications (06/26/2016):  This is the current hospital active medication list Current Facility-Administered Medications  Medication Dose Route Frequency Provider Last Rate Last Dose  . 0.9 %  sodium chloride infusion   Intravenous Continuous Meredeth IdeGagan S Lama, MD 125 mL/hr at 06/26/16 [phone]    . acetaminophen (TYLENOL) tablet 650 mg  650 mg Oral Q6H PRN Levie HeritageJacob J Stinson, DO       Or  . acetaminophen (TYLENOL) suppository 650 mg  650 mg Rectal Q6H PRN Rhona RaiderJacob J Stinson, DO      . apixaban (ELIQUIS) tablet 5 mg  5 mg Oral BID Rhona RaiderJacob J Stinson, DO   5 mg at 06/26/16 1000  . cefTRIAXone (ROCEPHIN) 1 g in dextrose 5 % 50 mL IVPB  1 g Intravenous Q24H Meredeth IdeGagan S Lama, MD   1 g at 06/25/16 1616  . feeding supplement (ENSURE ENLIVE) (ENSURE ENLIVE) liquid 237 mL  237 mL Oral BID BM Houston SirenPeter Le, MD   237 mL at 06/26/16 0958  . fenofibrate tablet 54 mg  54 mg Oral Daily Rhona RaiderJacob J Stinson, DO   54 mg at 06/26/16 1000  . folic acid (FOLVITE) tablet 1 mg  1 mg Oral Daily Rhona RaiderJacob J Stinson, DO   1 mg at 06/26/16 1000  .  hydrALAZINE (APRESOLINE) tablet 25 mg  25 mg Oral Q6H PRN Meredeth IdeGagan S Lama, MD      . loperamide (IMODIUM) capsule 2 mg  2 mg Oral PRN Levie HeritageJacob J Stinson, DO      . LORazepam (ATIVAN) injection 0-4 mg  0-4 mg Intravenous Q12H Leda GauzeKaren J Kirby-Graham, NP   1 mg at 06/26/16 0809  . magnesium oxide (MAG-OX) tablet 800 mg  800 mg Oral BID Rhona RaiderJacob J Stinson, DO   800 mg at 06/26/16 [id number]  . multivitamin with minerals tablet 1 tablet  1 tablet Oral Daily Levie HeritageJacob J Stinson, DO   1 tablet at 06/26/16 [id number]  . ondansetron (ZOFRAN) tablet 4 mg  4 mg Oral Q6H PRN Rhona RaiderJacob J Stinson, DO       Or  . ondansetron [facility](ZOFRAN) injection 4 mg  4 mg Intravenous Q6H PRN Rhona RaiderJacob J Stinson, DO      . sodium chloride flush (NS) 0.9 % injection 3 mL  3 mL  Intravenous Q12H Rhona RaiderJacob J Stinson, DO   3 mL at 06/25/16 2140  . thiamine (VITAMIN B-1) tablet 100 mg  100 mg Oral Daily Rhona RaiderJacob J Stinson, DO   100 mg at 06/26/16 [id number]   Or  . thiamine (B-1) injection 100 mg  100 mg Intravenous Daily Levie HeritageJacob J Stinson, DO         Discharge Medications: Please see discharge summary for a list of discharge medications.  Relevant Imaging Results:  Relevant Lab Results:   Additional Information    Karn CassisStultz, Kaylena Pacifico [place], [place] [phone]

## 2016-06-27 LAB — GLUCOSE, CAPILLARY: GLUCOSE-CAPILLARY: 151 mg/dL — AB (ref 65–99)

## 2016-06-27 NOTE — Progress Notes (Signed)
[facility]  PROGRESS NOTE  Corey CongoMichael Hartman JXB:[id number] DOB: 09-12-1960 DOA: 06/22/2016 PCP: Corey Hartman, Corey A, FNP   Brief HPI:   *55 year old male with Hartman history of hypertension, orthostasis, and alcoholism, presents from Dr. Ronal Fearoonquah's office with complaints of failure to thrive and weakness. While in the ED, Patient was noted to be hypokalemic and hypomagnesemic. Patient has elevated LFTs and anemia that are secondary to alcoholism. Patient has been started on CIWA protocol, potassium supplementation, magnesium supplementation, and admitted for further evaluation.    Subjective   Patient seen and examined, Denies any complaints. Pleasantly confused.   Assessment/Plan:     1. Alcohol abuse- no signs and symptoms of alcohol withdrawal, continue CIWA protocol. 2. UTI- ua was abnormal on 06/22/16, CT abdomen showed prominence of right renal pelvis.  Ceftriaxone per pharmacy consultation.Follow urine culture results 3. Hypokalemia-replaced potassium  4. Hepatic lesion- patient has  2.3 x 2.1 x 2.2 cm right hepatic hypoechoic solid-appearing focus. We obtained  CT abdomen pelvis with contrast which showed Severe diffuse hepatic steatosis.Focus of fatty sparing RIGHT hepatic lobe response to the ultrasound abnormality.  enhancement of the urothelium of the RIGHT renal pelvis appear recommend correlation for urinary tract infection 5. Failure to thrive- secondary to alcohol abuse, nutrition consulted. Started on [employer]nsure Enlive by mouth twice Hartman day. 6. Orthostasis- continue midodrine. 7. History of DVT-continue apixaban    DVT prophylaxis: Apixaban  Code Status: Full code  Family Communication: Discussed with sister and wife at bedside on 06/24/2016  Disposition Plan: Skilled nursing facility   Consultants:  None  Procedures:  Abdominal ultrasound  Continuous infusions . sodium chloride 125 mL/hr at 06/27/16 0822   none     Antibiotics:   Anti-infectives    Start     Dose/Rate Route Frequency Ordered Stop   06/25/16 1430  cefTRIAXone (ROCEPHIN) 1 g in dextrose 5 % 50 mL IVPB     1 g 100 mL/hr over 30 Minutes Intravenous Every 24 hours 06/25/16 1413         Objective   Vitals:   06/26/16 0645 06/26/16 1245 06/26/16 2056 06/27/16 0501  BP: 111/65 108/66 (!) 124/96 110/72  Pulse: 98 (!) 110 98 94  Resp: 16 17 18 18   Temp: 98 F (36.7 C) 98.6 F (37 C) 98.7 F (37.1 C) 98.4 F (36.9 C)  TempSrc: Oral Oral Oral Oral  SpO2: 97% 100% 100% 95%  Weight:      Height:        Intake/Output Summary (Last 24 hours) at 06/27/16 1217 Last data filed at 06/27/16 1153  Gross per 24 hour  Intake          3311.75 ml  Output             1700 ml  Net          1611.75 ml   Filed Weights   06/22/16 1321 06/22/16 1754  Weight: 74.8 kg (165 lb) 73 kg (161 lb)     Physical Examination:  General exam: Appears calm and comfortable. Respiratory system: Clear to auscultation. Respiratory effort normal. Cardiovascular system:  RRR. No  murmurs, rubs, gallops. No pedal edema. GI system: Abdomen is nondistended, soft and nontender. No organomegaly.  Central nervous system. No focal neurological deficits. 5 x 5 power in all extremities. Skin: No rashes, lesions or ulcers. Psychiatry: Alert, oriented x 2.Patient lacks judgment and insight to make complex decision medical decisions at this time    Data Reviewed: I  have personally reviewed following labs and imaging studies  CBG:  Recent Labs Lab 06/22/16 1353  GLUCAP 99    CBC:  Recent Labs Lab 06/22/16 1418  WBC 5.8  HGB 8.5*  HCT 24.5*  MCV 84.8  PLT 145*    Basic Metabolic Panel:  Recent Labs Lab 06/22/16 1418 06/23/16 0522 06/25/16 0617  NA 133* 133* 137  K 2.8* 3.0* 4.1  CL 85* 92* 97*  CO2 29 30 32  GLUCOSE 113* 110* 100*  BUN 12 14 7   CREATININE 1.14 0.86 0.90  CALCIUM 9.1 8.4* 10.2  MG 0.9* 1.3*  --     No results found for this or any previous visit (from  the past 240 hour(s)).   Liver Function Tests:  Recent Labs Lab 06/22/16 1418 06/25/16 0617  AST 114* 140*  ALT 27 52  ALKPHOS 140* 111  BILITOT 2.6* 0.7  PROT 7.1 6.4*  ALBUMIN 3.5 3.1*   No results for input(s): LIPASE, AMYLASE in the last 168 hours. No results for input(s): AMMONIA in the last 168 hours.  Cardiac Enzymes: No results for input(s): CKTOTAL, CKMB, CKMBINDEX, TROPONINI in the last 168 hours. BNP (last 3 results) No results for input(s): BNP in the last 8760 hours.  ProBNP (last 3 results) No results for input(s): PROBNP in the last 8760 hours.    Studies: No results found.  Scheduled Meds: . apixaban  5 mg Oral BID  . cefTRIAXone (ROCEPHIN)  IV  1 g Intravenous Q24H  . feeding supplement (ENSURE ENLIVE)  237 mL Oral BID BM  . fenofibrate  54 mg Oral Daily  . folic acid  1 mg Oral Daily  . LORazepam  0-4 mg Intravenous Q12H  . magnesium oxide  800 mg Oral BID  . multivitamin with minerals  1 tablet Oral Daily  . sodium chloride flush  3 mL Intravenous Q12H  . thiamine  100 mg Oral Daily   Or  . thiamine  100 mg Intravenous Daily      Time spent: 25 min  [facility]AMA,Jhaden Pizzuto S   [facility] Pager [phone]. If 7PM-7AM, please contact night-coverage at [email or web address], Office  [phone]  password [email or web address] 06/27/2016, 12:17 PM  LOS: 5 days

## 2016-06-27 NOTE — Progress Notes (Signed)
Physical Therapy Treatment Patient Details Name: Corey Hartman MRN: [id number] DOB: 1960-08-30 Today's Date: 06/27/2016    History of Present Illness 55 y.o. male with a history of hypertension, orthostasis, alcoholism. Patient presents from Dr. Ronal Fearoonquah's office due to failure to thrive and weakness. He was seen there for follow-up of his orthostasis and weakness was noted to have a 30 pound weight loss in the past several months. In questioning the patient, the patient has been drinking 5-6 drinks of liquor every night. His oral food intake has been pretty minimal. He does report chronic diarrhea. His symptoms are worsening. His been noticing increased weakness with presyncopal symptoms that have been increasing. He has been prescribed Midodrin for the orthostasis, but he has not started this yet. His last alcohol was yesterday.    PT Comments    Pt received in bed, and was agreeable to PT tx.  Pt continues to require assistance for sit<>stand due to posterior lean.  He demonstrates a very shuffled and forward flexed gait pattern.  Although he answers orientation questions correctly, he appears to be apraxic when it comes to ADL's.  After tx, RN notified PT that telemetry had contacted her regarding the pt's HR, which had elevated up to the 140's during gait.  Continue to recommend that pt have 24/7 supervision/assistance.  He would benefit from an ALF setting where he could receive HHPT.    Follow Up Recommendations  Supervision/Assistance - 24 hour;Other (comment);Home health PT     Equipment Recommendations  Rolling walker with 5" wheels;Wheelchair (measurements PT)    Recommendations for Other Services       Precautions / Restrictions Precautions Precautions: Fall Precaution Comments: 1 fall - Pt states he passed out due to BG dropping too low.  Restrictions Weight Bearing Restrictions: No    Mobility  Bed Mobility Overal bed mobility: Modified Independent                 Transfers Overall transfer level: Needs assistance Equipment used: Rolling walker (2 wheeled) Transfers: Sit to/from Stand Sit to Stand: Min assist;Mod assist         General transfer comment: Pt demonstrated posterior lean upon standing.  After ambulation, had pt standing at sink to perform balance activities with ADL's, and he suddenly stated he needed to use the bathroom, and then had a BM on the floor.  Pt was assisted into the bathroom and he required assistance for peri-hygiene  Ambulation/Gait Ambulation/Gait assistance: Min assist Ambulation Distance (Feet): 150 Feet Assistive device: Rolling walker (2 wheeled) Gait Pattern/deviations: Shuffle;Trunk flexed     General Gait Details: Pt requires cues to keep RW close to his body.     Stairs            Wheelchair Mobility    Modified Rankin (Stroke Patients Only)       Balance Overall balance assessment: Needs assistance;History of Falls Sitting-balance support: Feet supported;Bilateral upper extremity supported Sitting balance-Leahy Scale: Fair   Postural control: Posterior lean Standing balance support: Bilateral upper extremity supported Standing balance-Leahy Scale: Poor                      Cognition Arousal/Alertness: Awake/alert Behavior During Therapy: Flat affect Overall Cognitive Status: Impaired/Different from baseline Area of Impairment: Safety/judgement;Awareness         Safety/Judgement: Decreased awareness of safety;Decreased awareness of deficits Awareness: Anticipatory        Exercises      General Comments  Pertinent Vitals/Pain Pain Assessment: No/denies pain    Home Living                      Prior Function            PT Goals (current goals can now be found in the care plan section) Acute Rehab PT Goals Patient Stated Goal: 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  PT Goal Formulation: With patient Time For Goal Achievement:  06/30/16 Potential to Achieve Goals: Fair Progress towards PT goals: Progressing toward goals    Frequency    Min 3X/week      PT Plan Current plan remains appropriate    Co-evaluation             End of Session Equipment Utilized During Treatment: Gait belt Activity Tolerance: Patient tolerated treatment well Patient left: in bed;with call bell/phone within reach;with bed alarm set     Time: 1530-1600 PT Time Calculation (min) (ACUTE ONLY): 30 min  Charges:  $Gait Training: 8-22 mins $Self Care/Home Management: 8-22                    G Codes:      Beth Yovana Scogin, PT, DPT X: [phone]

## 2016-06-27 NOTE — Progress Notes (Signed)
Offered several times to assist patient up to chair. Pt stated comfortable in bed, repositions self, pillows in place for comfort. Ambulates in room with standby assist and walker. Earnstine RegalAshley Cruzito Standre, RN

## 2016-06-27 NOTE — Plan of Care (Signed)
Problem: Safety: Goal: Ability to remain free from injury will improve Outcome: Progressing Discussed fall prevention, safety plan with patient. Call light, phone, personal items within reach. Safety mats on floor at bedside. telesitter in place. Pt verbalizes understanding to call for assistance and not attempt getting up on his own. bedalarm on for safety. Earnstine RegalAshley Abdias Hickam, RN

## 2016-06-28 LAB — URINE CULTURE

## 2016-06-28 MED ORDER — LEVOFLOXACIN IN D5W 500 MG/100ML IV SOLN
500.0000 mg | INTRAVENOUS | Status: DC
  Administered 2016-06-28: 500 mg via INTRAVENOUS
  Filled 2016-06-28: qty 100

## 2016-06-28 NOTE — Progress Notes (Addendum)
Pharmacy Antibiotic Note  Corey Hartman is a 55 y.o. male admitted on 06/22/2016 with UTI.  Pharmacy has been consulted for Ceftriaxone dosing.  Plan:  Continue Ceftriaxone 1gm IV q24h  Consider switch to Levaquin 500mg  PO daily x 3 days given UCx results  F/U progress  Height: 6\' 2"  (188 cm) Weight: 161 lb (73 kg) IBW/kg (Calculated) : 82.2  Temp (24hrs), Avg:98.2 F (36.8 C), Min:98 F (36.7 C), Max:98.4 F (36.9 C)   Recent Labs Lab 06/22/16 1418 06/23/16 0522 06/25/16 0617  WBC 5.8  --   --   CREATININE 1.14 0.86 0.90    Estimated Creatinine Clearance: 98 mL/min (by C-G formula based on SCr of 0.9 mg/dL).    No Known Allergies  Antimicrobials this admission: Ceftriaxone 11/30 >>   Recent Results (from the past 240 hour(s))  Culture, Urine     Status: Abnormal   Collection Time: 06/25/16  4:10 PM  Result Value Ref Range Status   Specimen Description URINE, CLEAN CATCH  Final   Special Requests NONE  Final   Culture >=100,000 COLONIES/mL ENTEROCOCCUS FAECALIS (A)  Final   Report Status 06/28/2016 FINAL  Final   Organism ID, Bacteria ENTEROCOCCUS FAECALIS (A)  Final      Susceptibility   Enterococcus faecalis - MIC*    AMPICILLIN <=2 SENSITIVE Sensitive     LEVOFLOXACIN 0.5 SENSITIVE Sensitive     NITROFURANTOIN <=16 SENSITIVE Sensitive     VANCOMYCIN 1 SENSITIVE Sensitive     * >=100,000 COLONIES/mL ENTEROCOCCUS FAECALIS   Thank you for allowing pharmacy to be a part of this patient's care.  Corey HartScott Isaih Hartman, PharmD Clinical Pharmacist Pager:  [phone] 06/28/2016   06/28/2016 9:16 AM

## 2016-06-28 NOTE — Progress Notes (Signed)
[facility]  PROGRESS NOTE  Corey CongoMichael Wernette BJY:[id number] DOB: July 22, 1961 DOA: 06/22/2016 PCP: Lenoria ChimeWhite, Valerie A, FNP   Brief HPI:   *55 year old male with a history of hypertension, orthostasis, and alcoholism, presents from Dr. Ronal Fearoonquah's office with complaints of failure to thrive and weakness. While in the ED, Patient was noted to be hypokalemic and hypomagnesemic. Patient has elevated LFTs and anemia that are secondary to alcoholism. Patient has been started on CIWA protocol, potassium supplementation, magnesium supplementation, and admitted for further evaluation.    Subjective   Patient seen and examined, Denies any complaints. Pleasantly confused.   Assessment/Plan:     1. Alcohol abuse- no signs and symptoms of alcohol withdrawal, continue CIWA protocol. 2. UTI- ua was abnormal on 06/22/16, CT abdomen showed prominence of right renal pelvis.  Urine culture growing enterococcus, will start levaquin and discontinue  3. Hypokalemia-replaced potassium  4. Hepatic lesion- patient has  2.3 x 2.1 x 2.2 cm right hepatic hypoechoic solid-appearing focus. We obtained  CT abdomen pelvis with contrast which showed Severe diffuse hepatic steatosis.Focus of fatty sparing RIGHT hepatic lobe response to the ultrasound abnormality.  enhancement of the urothelium of the RIGHT renal pelvis appear recommend correlation for urinary tract infection 5. Failure to thrive- secondary to alcohol abuse, nutrition consulted. Started on [employer]ure Enlive by mouth twice a day. 6. Orthostasis- continue midodrine. 7. History of DVT-continue apixaban    DVT prophylaxis: Apixaban  Code Status: Full code  Family Communication: Discussed with sister and wife at bedside on 06/24/2016  Disposition Plan: Skilled nursing facility   Consultants:  None  Procedures:  Abdominal ultrasound  Continuous infusions . sodium chloride 125 mL/hr at 06/28/16 1451   none     Antibiotics:   Anti-infectives     Start     Dose/Rate Route Frequency Ordered Stop   06/28/16 1515  levofloxacin (LEVAQUIN) IVPB 500 mg     500 mg 100 mL/hr over 60 Minutes Intravenous Every 24 hours 06/28/16 1500     06/25/16 1430  cefTRIAXone (ROCEPHIN) 1 g in dextrose 5 % 50 mL IVPB  Status:  Discontinued     1 g 100 mL/hr over 30 Minutes Intravenous Every 24 hours 06/25/16 1413 06/28/16 1501       Objective   Vitals:   06/27/16 1425 06/27/16 1541 06/27/16 2017 06/28/16 0623  BP: 122/72 127/78 (!) 123/91 (!) 154/91  Pulse: 90 100 (!) 118 88  Resp: 16 17 20 20   Temp:  98.1 F (36.7 C) 98 F (36.7 C) 98.4 F (36.9 C)  TempSrc:  Oral Oral Oral  SpO2: 100% 100% 99% 100%  Weight:      Height:        Intake/Output Summary (Last 24 hours) at 06/28/16 1501 Last data filed at 06/28/16 0825  Gross per 24 hour  Intake          1914.58 ml  Output              900 ml  Net          1014.58 ml   Filed Weights   06/22/16 1321 06/22/16 1754  Weight: 74.8 kg (165 lb) 73 kg (161 lb)     Physical Examination:  General exam: Appears calm and comfortable. Respiratory system: Clear to auscultation. Respiratory effort normal. Cardiovascular system:  RRR. No  murmurs, rubs, gallops. No pedal edema. GI system: Abdomen is nondistended, soft and nontender. No organomegaly.  Central nervous system. No focal neurological deficits. 5  x 5 power in all extremities. Skin: No rashes, lesions or ulcers. Psychiatry: Alert, oriented x 2.Patient lacks judgment and insight to make complex decision medical decisions at this time    Data Reviewed: I have personally reviewed following labs and imaging studies  CBG:  Recent Labs Lab 06/22/16 1353 06/27/16 1641  GLUCAP 99 151*    CBC:  Recent Labs Lab 06/22/16 1418  WBC 5.8  HGB 8.5*  HCT 24.5*  MCV 84.8  PLT 145*    Basic Metabolic Panel:  Recent Labs Lab 06/22/16 1418 06/23/16 0522 06/25/16 0617  NA 133* 133* 137  K 2.8* 3.0* 4.1  CL 85* 92* 97*  CO2  29 30 32  GLUCOSE 113* 110* 100*  BUN 12 14 7   CREATININE 1.14 0.86 0.90  CALCIUM 9.1 8.4* 10.2  MG 0.9* 1.3*  --     Recent Results (from the past 240 hour(s))  Culture, Urine     Status: Abnormal   Collection Time: 06/25/16  4:10 PM  Result Value Ref Range Status   Specimen Description URINE, CLEAN CATCH  Final   Special Requests NONE  Final   Culture >=100,000 COLONIES/mL ENTEROCOCCUS FAECALIS (A)  Final   Report Status 06/28/2016 FINAL  Final   Organism ID, Bacteria ENTEROCOCCUS FAECALIS (A)  Final      Susceptibility   Enterococcus faecalis - MIC*    AMPICILLIN <=2 SENSITIVE Sensitive     LEVOFLOXACIN 0.5 SENSITIVE Sensitive     NITROFURANTOIN <=16 SENSITIVE Sensitive     VANCOMYCIN 1 SENSITIVE Sensitive     * >=100,000 COLONIES/mL ENTEROCOCCUS FAECALIS     Liver Function Tests:  Recent Labs Lab 06/22/16 1418 06/25/16 0617  AST 114* 140*  ALT 27 52  ALKPHOS 140* 111  BILITOT 2.6* 0.7  PROT 7.1 6.4*  ALBUMIN 3.5 3.1*   No results for input(s): LIPASE, AMYLASE in the last 168 hours. No results for input(s): AMMONIA in the last 168 hours.  Cardiac Enzymes: No results for input(s): CKTOTAL, CKMB, CKMBINDEX, TROPONINI in the last 168 hours. BNP (last 3 results) No results for input(s): BNP in the last 8760 hours.  ProBNP (last 3 results) No results for input(s): PROBNP in the last 8760 hours.    Studies: No results found.  Scheduled Meds: . apixaban  5 mg Oral BID  . feeding supplement (ENSURE ENLIVE)  237 mL Oral BID BM  . fenofibrate  54 mg Oral Daily  . folic acid  1 mg Oral Daily  . levofloxacin (LEVAQUIN) IV  500 mg Intravenous Q24H  . magnesium oxide  800 mg Oral BID  . multivitamin with minerals  1 tablet Oral Daily  . sodium chloride flush  3 mL Intravenous Q12H  . thiamine  100 mg Oral Daily   Or  . thiamine  100 mg Intravenous Daily      Time spent: 25 min  [facility] CenterAMA,Tacoya Altizer S   [facility] Pager [phone]. If 7PM-7AM, please  contact night-coverage at [email or web address], Office  [phone]  password [email or web address] 06/28/2016, 3:01 PM  LOS: 6 days

## 2016-06-29 LAB — CBC
HEMATOCRIT: 20.2 % — AB (ref 39.0–52.0)
HEMATOCRIT: 20.7 % — AB (ref 39.0–52.0)
HEMATOCRIT: 23.7 % — AB (ref 39.0–52.0)
HEMOGLOBIN: 6.7 g/dL — AB (ref 13.0–17.0)
HEMOGLOBIN: 7 g/dL — AB (ref 13.0–17.0)
HEMOGLOBIN: 8.1 g/dL — AB (ref 13.0–17.0)
MCH: 29.9 pg (ref 26.0–34.0)
MCH: 30.3 pg (ref 26.0–34.0)
MCH: 30.5 pg (ref 26.0–34.0)
MCHC: 33.2 g/dL (ref 30.0–36.0)
MCHC: 33.8 g/dL (ref 30.0–36.0)
MCHC: 34.2 g/dL (ref 30.0–36.0)
MCV: 90.2 fL (ref 78.0–100.0)
MCV: 89.6 fL (ref 78.0–100.0)
MCV: 89.1 fL (ref 78.0–100.0)
PLATELETS: 402 10*3/uL — AB (ref 150–400)
Platelets: 381 10*3/uL (ref 150–400)
Platelets: 425 10*3/uL — ABNORMAL HIGH (ref 150–400)
RBC: 2.24 MIL/uL — AB (ref 4.22–5.81)
RBC: 2.31 MIL/uL — ABNORMAL LOW (ref 4.22–5.81)
RBC: 2.66 MIL/uL — ABNORMAL LOW (ref 4.22–5.81)
RDW: 19 % — ABNORMAL HIGH (ref 11.5–15.5)
RDW: 19.1 % — ABNORMAL HIGH (ref 11.5–15.5)
RDW: 18.1 % — AB (ref 11.5–15.5)
WBC: 4.4 10*3/uL (ref 4.0–10.5)
WBC: 4.8 10*3/uL (ref 4.0–10.5)
WBC: 5.6 10*3/uL (ref 4.0–10.5)

## 2016-06-29 LAB — PREPARE RBC (CROSSMATCH)

## 2016-06-29 LAB — BASIC METABOLIC PANEL
ANION GAP: 5 (ref 5–15)
BUN: 7 mg/dL (ref 6–20)
CHLORIDE: 107 mmol/L (ref 101–111)
CO2: 25 mmol/L (ref 22–32)
CREATININE: 0.94 mg/dL (ref 0.61–1.24)
Calcium: 9.2 mg/dL (ref 8.9–10.3)
GFR calc non Af Amer: >60 mL/min (ref >60)
GLUCOSE: 129 mg/dL — AB (ref 65–99)
Potassium: 3.5 mmol/L (ref 3.5–5.1)
Sodium: 137 mmol/L (ref 135–145)

## 2016-06-29 LAB — ABO/RH: ABO/RH(D): O POS

## 2016-06-29 MED ORDER — LEVOFLOXACIN 500 MG PO TABS
500.0000 mg | ORAL_TABLET | Freq: Every day | ORAL | Status: DC
  Administered 2016-06-29 – 2016-06-30 (×2): 500 mg via ORAL
  Filled 2016-06-29 (×2): qty 1

## 2016-06-29 MED ORDER — FUROSEMIDE 10 MG/ML IJ SOLN
20.0000 mg | Freq: Once | INTRAMUSCULAR | Status: AC
  Administered 2016-06-29: 20 mg via INTRAVENOUS
  Filled 2016-06-29: qty 2

## 2016-06-29 MED ORDER — POTASSIUM CHLORIDE CRYS ER 20 MEQ PO TBCR
40.0000 meq | EXTENDED_RELEASE_TABLET | Freq: Once | ORAL | Status: AC
  Administered 2016-06-29: 40 meq via ORAL
  Filled 2016-06-29: qty 2

## 2016-06-29 MED ORDER — SODIUM CHLORIDE 0.9 % IV SOLN
Freq: Once | INTRAVENOUS | Status: AC
  Administered 2016-06-29: 15:00:00 via INTRAVENOUS

## 2016-06-29 NOTE — Progress Notes (Signed)
[facility]  PROGRESS NOTE  Corey Hartman RUE:[id number] DOB: 09/05/1960 DOA: 06/22/2016 PCP: Lenoria ChimeWhite, Valerie A, FNP   Brief HPI:   *55 year old male with a history of hypertension, orthostasis, and alcoholism, presents from Dr. Ronal Fearoonquah's office with complaints of failure to thrive and weakness. While in the ED, Patient was noted to be hypokalemic and hypomagnesemic. Patient has elevated LFTs and anemia that are secondary to alcoholism. Patient has been started on CIWA protocol, potassium supplementation, magnesium supplementation, and admitted for further evaluation.    Subjective   Patient seen and examined, Denies any complaints. Hemoglobin is 6.7 this morning, repeat 7.0   Assessment/Plan:     1. Alcohol abuse- no signs and symptoms of alcohol withdrawal, continue CIWA protocol. 2. Chronic anemia- likely dilutional, will transfused one unit PRBC. Will give one dose of Lasix 20 mg IV. 3. UTI- ua was abnormal on 06/22/16, CT abdomen showed prominence of right renal pelvis.  Urine culture growing enterococcus, will continue Levaquin   4. Hypokalemia-replaced potassium  5. Hepatic lesion- patient has  2.3 x 2.1 x 2.2 cm right hepatic hypoechoic solid-appearing focus. We obtained  CT abdomen pelvis with contrast which showed Severe diffuse hepatic steatosis.Focus of fatty sparing RIGHT hepatic lobe response to the ultrasound abnormality.  enhancement of the urothelium of the RIGHT renal pelvis appear recommend correlation for urinary tract infection 6. Failure to thrive- secondary to alcohol abuse, nutrition consulted. Started on [employer] by mouth twice a day. 7. Orthostasis- continue midodrine. 8. History of DVT-continue apixaban    DVT prophylaxis: Apixaban  Code Status: Full code  Family Communication: Discussed with sister and wife at bedside on 06/24/2016  Disposition Plan: Skilled nursing facility   Consultants:  None  Procedures:  Abdominal  ultrasound  Continuous infusions  none     Antibiotics:   Anti-infectives    Start     Dose/Rate Route Frequency Ordered Stop   06/29/16 1000  levofloxacin (LEVAQUIN) tablet 500 mg     500 mg Oral Daily 06/29/16 0742 07/03/16 0959   06/28/16 1600  levofloxacin (LEVAQUIN) IVPB 500 mg  Status:  Discontinued     500 mg 100 mL/hr over 60 Minutes Intravenous Every 24 hours 06/28/16 1500 06/29/16 0742   06/25/16 1430  cefTRIAXone (ROCEPHIN) 1 g in dextrose 5 % 50 mL IVPB  Status:  Discontinued     1 g 100 mL/hr over 30 Minutes Intravenous Every 24 hours 06/25/16 1413 06/28/16 1501       Objective   Vitals:   06/28/16 2012 06/29/16 0616 06/29/16 1425 06/29/16 1506  BP: 128/72 (!) 158/86 138/83 (!) 148/84  Pulse: 80 86 96 (!) 106  Resp: 20 20 (!) 21 20  Temp: 99.1 F (37.3 C) 98.9 F (37.2 C) 98.4 F (36.9 C) 98.6 F (37 C)  TempSrc: Oral Oral Oral Oral  SpO2: 100% 100% 100% 100%  Weight:      Height:        Intake/Output Summary (Last 24 hours) at 06/29/16 1557 Last data filed at 06/29/16 1451  Gross per 24 hour  Intake             1728 ml  Output             4150 ml  Net            -2422 ml   Filed Weights   06/22/16 1321 06/22/16 1754  Weight: 74.8 kg (165 lb) 73 kg (161 lb)  Physical Examination:  General exam: Appears calm and comfortable. Respiratory system: Clear to auscultation. Respiratory effort normal. Cardiovascular system:  RRR. No  murmurs, rubs, gallops. No pedal edema. GI system: Abdomen is nondistended, soft and nontender. No organomegaly.  Central nervous system. No focal neurological deficits. 5 x 5 power in all extremities. Skin: No rashes, lesions or ulcers. Psychiatry: Alert, oriented x 3.Patient now has  judgment and insight to make complex decision medical decisions at this time    Data Reviewed: I have personally reviewed following labs and imaging studies  CBG:  Recent Labs Lab 06/27/16 1641  GLUCAP 151*     CBC:  Recent Labs Lab 06/29/16 0500 06/29/16 0956  WBC 4.4 4.8  HGB 6.7* 7.0*  HCT 20.2* 20.7*  MCV 90.2 89.6  PLT 402* 381    Basic Metabolic Panel:  Recent Labs Lab 06/23/16 0522 06/25/16 0617 06/29/16 0956  NA 133* 137 137  K 3.0* 4.1 3.5  CL 92* 97* 107  CO2 30 32 25  GLUCOSE 110* 100* 129*  BUN 14 7 7   CREATININE 0.86 0.90 0.94  CALCIUM 8.4* 10.2 9.2  MG 1.3*  --   --     Recent Results (from the past 240 hour(s))  Culture, Urine     Status: Abnormal   Collection Time: 06/25/16  4:10 PM  Result Value Ref Range Status   Specimen Description URINE, CLEAN CATCH  Final   Special Requests NONE  Final   Culture >=100,000 COLONIES/mL ENTEROCOCCUS FAECALIS (A)  Final   Report Status 06/28/2016 FINAL  Final   Organism ID, Bacteria ENTEROCOCCUS FAECALIS (A)  Final      Susceptibility   Enterococcus faecalis - MIC*    AMPICILLIN <=2 SENSITIVE Sensitive     LEVOFLOXACIN 0.5 SENSITIVE Sensitive     NITROFURANTOIN <=16 SENSITIVE Sensitive     VANCOMYCIN 1 SENSITIVE Sensitive     * >=100,000 COLONIES/mL ENTEROCOCCUS FAECALIS     Liver Function Tests:  Recent Labs Lab 06/25/16 0617  AST 140*  ALT 52  ALKPHOS 111  BILITOT 0.7  PROT 6.4*  ALBUMIN 3.1*   No results for input(s): LIPASE, AMYLASE in the last 168 hours. No results for input(s): AMMONIA in the last 168 hours.  Cardiac Enzymes: No results for input(s): CKTOTAL, CKMB, CKMBINDEX, TROPONINI in the last 168 hours. BNP (last 3 results) No results for input(s): BNP in the last 8760 hours.  ProBNP (last 3 results) No results for input(s): PROBNP in the last 8760 hours.    Studies: No results found.  Scheduled Meds: . apixaban  5 mg Oral BID  . feeding supplement (ENSURE ENLIVE)  237 mL Oral BID BM  . fenofibrate  54 mg Oral Daily  . folic acid  1 mg Oral Daily  . levofloxacin  500 mg Oral Daily  . magnesium oxide  800 mg Oral BID  . multivitamin with minerals  1 tablet Oral Daily  .  thiamine  100 mg Oral Daily   Or  . thiamine  100 mg Intravenous Daily      Time spent: 25 min  [facility],Delaney Schnick S   [facility] Pager [phone]. If 7PM-7AM, please contact night-coverage at [email or web address], Office  [phone]  password [email or web address] 06/29/2016, 3:57 PM  LOS: 7 days

## 2016-06-29 NOTE — Progress Notes (Signed)
Called blood bank. Awaiting patient's unit of blood.

## 2016-06-29 NOTE — Care Management Note (Signed)
Case Management Note  Patient Details  Name: Peter CongoMichael Crossan MRN: [id number] Date of Birth: Aug 04, 1960   Expected Discharge Date:  06/24/16               Expected Discharge Plan:  Home/Self Care  In-House Referral:  Clinical Social Work  Discharge planning Services  CM Consult  Post Acute Care Choice:  Home Health Choice offered to:  Patient  DME Arranged:  Dan HumphreysWalker rolling DME Agency:  [facility]  HH Arranged:  PT [facility]  Status of Service:  Completed, signed off  If discussed at Long Length of Stay Meetings, dates discussed:    Additional Comments:  Jung Ingerson, Chrystine OilerSharley Diane, RN 06/29/2016, 10:59 AM

## 2016-06-29 NOTE — Progress Notes (Signed)
PHARMACIST - PHYSICIAN COMMUNICATION DR:   Sharl MaLama CONCERNING: Antibiotic IV to Oral Route Change Policy  RECOMMENDATION: This patient is receiving LEVAQUIN by the intravenous route.  Based on criteria approved by the Pharmacy and Therapeutics Committee, the antibiotic(s) is/are being converted to the equivalent oral dose form(s).  DESCRIPTION: These criteria include:  Patient being treated for a respiratory tract infection, urinary tract infection, cellulitis or clostridium difficile associated diarrhea if on metronidazole  The patient is not neutropenic and does not exhibit a GI malabsorption state  The patient is eating (either orally or via tube) and/or has been taking other orally administered medications for a least 24 hours  The patient is improving clinically and has a Tmax < 100.5  If you have questions about this conversion, please contact the Pharmacy Department  [x]   [phone]( [phone] )  [facility] []   [phone]( [phone] )  [facility] []   [phone]( [phone] )  [facility] []   [phone]( [phone] )  [facility] []   [phone]( [phone] )  [facility]   Valrie HartScott Ciji Boston, PharmD Clinical Pharmacist Pager:  [phone] 06/29/2016

## 2016-06-29 NOTE — Clinical Social Work Note (Signed)
CSW followed up with pt this morning regarding d/c plan as per staff, mental status is much improved. Discussed with MD as well. Pt has decided to return home with home health and will pursue placement from home later. CSW called pt's daughter Marcelino DusterMichelle with pt's permission and she understands that pt will d/c today. CSW signing off.  Derenda FennelKara Junell Cullifer, LCSW [phone]

## 2016-06-29 NOTE — Progress Notes (Signed)
Physical Therapy Treatment Patient Details Name: Corey CongoMichael Dilley MRN: [id number] DOB: 12/15/1960 Today's Date: 06/29/2016    History of Present Illness 55 y.o. male with a history of hypertension, orthostasis, alcoholism. Patient presents from Dr. Ronal Fearoonquah's office due to failure to thrive and weakness. He was seen there for follow-up of his orthostasis and weakness was noted to have a 30 pound weight loss in the past several months. In questioning the patient, the patient has been drinking 5-6 drinks of liquor every night. His oral food intake has been pretty minimal. He does report chronic diarrhea. His symptoms are worsening. His been noticing increased weakness with presyncopal symptoms that have been increasing. He has been prescribed Midodrin for the orthostasis, but he has not started this yet. His last alcohol was yesterday.    PT Comments    Pt received in bed, he seems in better spirits and improved overall cognitive status today.  Pt was able to increase gait distance to at least 85900ft with improved fluidity and quality of gait, however still with LOB when negotiating obstacles, or when people are walking towards him.  Continue to recommend 24/7 supervision/assistance upon d/c, as well as HHPT.    Follow Up Recommendations  Supervision/Assistance - 24 hour;Other (comment);Home health PT     Equipment Recommendations  Rolling walker with 5" wheels    Recommendations for Other Services       Precautions / Restrictions Precautions Precautions: Fall Precaution Comments: 1 fall - Pt states he passed out due to BG dropping too low.  Restrictions Weight Bearing Restrictions: No    Mobility  Bed Mobility Overal bed mobility: Modified Independent                Transfers Overall transfer level: Modified independent Equipment used: Rolling walker (2 wheeled) Transfers: Sit to/from Stand Sit to Stand: Modified independent (Device/Increase time) (vc's for hand placement)             Ambulation/Gait Ambulation/Gait assistance: Modified independent (Device/Increase time) Ambulation Distance (Feet): 800 Feet Assistive device: Rolling walker (2 wheeled) Gait Pattern/deviations: Step-through pattern;Trunk flexed     General Gait Details: improved fluidity and quality of gait.  Occasional LOB when negotiating obstacles, or as people are walking towards him.     Stairs            Wheelchair Mobility    Modified Rankin (Stroke Patients Only)       Balance Overall balance assessment: Needs assistance         Standing balance support: Bilateral upper extremity supported Standing balance-Leahy Scale: Fair                      Cognition Arousal/Alertness: Awake/alert Behavior During Therapy: Flat affect Overall Cognitive Status: Within Functional Limits for tasks assessed                      Exercises      General Comments        Pertinent Vitals/Pain Pain Assessment: No/denies pain    Home Living                      Prior Function            PT Goals (current goals can now be found in the care plan section) Acute Rehab PT Goals Time For Goal Achievement: 07/06/16 Potential to Achieve Goals: Fair Progress towards PT goals: Progressing toward goals    Frequency  Min 3X/week      PT Plan Current plan remains appropriate    Co-evaluation             End of Session Equipment Utilized During Treatment: Gait belt Activity Tolerance: Patient tolerated treatment well Patient left: in bed;with call bell/phone within reach;with bed alarm set;with nursing/sitter in room     Time: 1010-1030 PT Time Calculation (min) (ACUTE ONLY): 20 min  Charges:  $Gait Training: 8-22 mins                    G Codes:      Beth Oziel Beitler, PT, DPT X: [phone]

## 2016-06-29 NOTE — Care Management Note (Addendum)
Case Management Note  Patient Details  Name: Peter CongoMichael Roppolo MRN: [id number] Date of Birth: May 20, 1961    Expected Discharge Date:  06/24/16               Expected Discharge Plan:  Home/Self Care  In-House Referral:  Clinical Social Work  Discharge planning Services  CM Consult  Post Acute Care Choice:  Home Health Choice offered to:  Patient  DME Arranged:  Dan HumphreysWalker rolling DME Agency:  [facility]  HH Arranged:  PT [facility]  Status of Service:  Completed, signed off  If discussed at Long Length of Stay Meetings, dates discussed:    Additional Comments: Patient discharging today with Home Health PT ([facility]). Patient does not have a phone number that is working currently, [facility] will contact daughter, Marcelino DusterMichelle to set up first visit. Patient agreeable. Patient already had RW delivered to room. Patient to discharge home today after blood transfusion. Will fax order to [facility].  Will fax DC summary when available.  Tasheba Henson, Chrystine OilerSharley Diane, RN 06/29/2016, 10:59 AM

## 2016-06-29 NOTE — Progress Notes (Signed)
Pt hemoglobin 6.7, MD made aware, will continue to monitor.

## 2016-06-30 LAB — CBC
HEMATOCRIT: 23.6 % — AB (ref 39.0–52.0)
HEMOGLOBIN: 8.1 g/dL — AB (ref 13.0–17.0)
MCH: 30.6 pg (ref 26.0–34.0)
MCHC: 34.3 g/dL (ref 30.0–36.0)
MCV: 89.1 fL (ref 78.0–100.0)
Platelets: 457 10*3/uL — ABNORMAL HIGH (ref 150–400)
RBC: 2.65 MIL/uL — ABNORMAL LOW (ref 4.22–5.81)
RDW: 18 % — AB (ref 11.5–15.5)
WBC: 6 10*3/uL (ref 4.0–10.5)

## 2016-06-30 LAB — TYPE AND SCREEN
ABO/RH(D): O POS
ANTIBODY SCREEN: NEGATIVE
UNIT DIVISION: 0

## 2016-06-30 LAB — BASIC METABOLIC PANEL
Anion gap: 5 (ref 5–15)
BUN: 7 mg/dL (ref 6–20)
CALCIUM: 9.3 mg/dL (ref 8.9–10.3)
CHLORIDE: 105 mmol/L (ref 101–111)
CO2: 26 mmol/L (ref 22–32)
CREATININE: 0.98 mg/dL (ref 0.61–1.24)
GFR calc Af Amer: >60 mL/min (ref >60)
GFR calc non Af Amer: >60 mL/min (ref >60)
GLUCOSE: 84 mg/dL (ref 65–99)
Potassium: 3.8 mmol/L (ref 3.5–5.1)
Sodium: 136 mmol/L (ref 135–145)

## 2016-06-30 MED ORDER — FOLIC ACID 1 MG PO TABS: 1.0000 mg | ORAL_TABLET | Freq: Every day | ORAL | 2 refills | Status: AC

## 2016-06-30 MED ORDER — VERAPAMIL HCL ER 120 MG PO TBCR: 120.0000 mg | EXTENDED_RELEASE_TABLET | Freq: Every day | ORAL | 2 refills | Status: AC

## 2016-06-30 MED ORDER — LEVOFLOXACIN 500 MG PO TABS: 500.0000 mg | ORAL_TABLET | Freq: Every day | ORAL | 0 refills | Status: AC

## 2016-06-30 MED ORDER — HYDROCHLOROTHIAZIDE 12.5 MG PO TABS: 12.5000 mg | ORAL_TABLET | Freq: Every day | ORAL | 2 refills | Status: AC

## 2016-06-30 MED ORDER — ENSURE ENLIVE PO LIQD: 237.0000 mL | Freq: Two times a day (BID) | ORAL | 12 refills | Status: AC

## 2016-06-30 NOTE — Care Management Note (Signed)
Case Management Note  Patient Details  Name: Corey Hartman MRN: [id number] Date of Birth: 1960-12-01  Expected Discharge Date:  06/24/16               Expected Discharge Plan:  Home/Self Care  In-House Referral:  Clinical Social Work  Discharge planning Services  CM Consult  Post Acute Care Choice:  Home Health Choice offered to:  Patient  DME Arranged:  Dan HumphreysWalker rolling DME Agency:  [facility]  HH Arranged:  PT [facility]  Status of Service:  Completed, signed off  If discussed at Long Length of Stay Meetings, dates discussed:  06/30/2016  Additional Comments:  Ercelle Winkles, Chrystine OilerSharley Diane, RN 06/30/2016, 10:38 AM

## 2016-06-30 NOTE — Care Management Note (Signed)
Case Management Note  Patient Details  Name: Kathryn Ozga MRN: [id number] Date of Birth: 06/16/1961  Expected Discharge Date:  06/24/16               ExpPeter Congoected Discharge Plan:  Home w Home Health Services  In-House Referral:  Clinical Social Work  Discharge planning Services  CM Consult  Post Acute Care Choice:  Home Health Choice offered to:  Patient  DME Arranged:  Walker rolling DME Agency:  [facility]  [facility]  [facility]  Status of Service:  Completed, signed off  If discussed at Long Length of Stay Meetings, dates discussed: 06/30/2016   Additional Comments: [facility] CM has [facility] with [facility]inda at [facility] and [facility]ed DC summary, they are aware of DC home today. Pt is aware HH has 48hrs to make first visit. Pt has walker at bedside. Pt states his daughter will come provide transportation for him.   Malcolm Metrohildress, Leanette Eutsler Demske, RN 06/30/2016, 11:50 AM

## 2016-06-30 NOTE — Discharge Summary (Addendum)
Physician Discharge Summary  Corey Hartman HYQ:[id number] DOB: [birth year] DOA: 06/22/2016  PCP: Corey Chime, FNP  Admit date: 06/22/2016 Discharge date: 06/30/2016  Time spent: *25 minutes  Recommendations for Outpatient Follow-up:  1. *Follow up PCP in 2 weeks 2. 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 Ronal Hartman office with complaints of failure to thrive and weakness. While in the ED, Patient was noted to be hypokalemic and hypomagnesemic. Patient has elevated LFTs and anemia that are secondary to alcoholism. Patient has been started on CIWA protocol, potassium supplementation, magnesium supplementation, and admitted for further evaluation  Hospital Course:  1. Alcohol abuse- no signs and symptoms of alcohol withdrawal. 2. Chronic anemia- likely dilutional, improved after  transfused one unit PRBC. Today hemoglobin is 8.1. 3. UTI- ua was abnormal on 06/22/16, CT abdomen showed prominence of right renal pelvis.  Urine culture growing enterococcus, will continue Levaquin for total 7 days.   4. Hypokalemia-replaced potassium  5. Hepatic lesion- patient has 2.3 x 2.1 x 2.2 cm right hepatic hypoechoic solid-appearing focus. We obtained  CT abdomen pelvis with contrast which showed Severe diffuse hepatic steatosis.Focus of fatty sparing RIGHT hepatic lobe response to the ultrasound  abnormality.  enhancement of the urothelium of the RIGHT renal pelvis appear recommend correlation for urinary tract infection 6. Failure to thrive- secondary to alcohol abuse, nutrition consulted. Started on [employer] by mouth twice a day. 7. History of DVT-continue apixaban 8. Hypertension- BP stable without meds in the hospital, will change HCTZ to 12.5 mg po daily, Verapamil to 120 mg po daily, and continue Ramipril 10 mg daily.   Procedures:  None   Consultations:  None   Discharge Exam: Vitals:   06/29/16 2037 06/30/16 0554  BP: 135/79 (!) 147/89  Pulse: 87 90  Resp:  20  Temp: 98.3 F (36.8 C) 99.3 F (37.4 C)    General: Appears in no acute distress Cardiovascular: RRR, S1S2 Respiratory: Clear bilaterally  Discharge Instructions   Discharge Instructions    Diet - low sodium heart healthy    Complete by:  As directed    Increase activity slowly    Complete by:  As directed      Current Discharge Medication List    START taking these medications   Details  folic acid (FOLVITE) 1 MG tablet Take 1 tablet (1 mg total) by mouth daily. Qty: 30 tablet, Refills: 2    levofloxacin (LEVAQUIN) 500 MG tablet Take 1 tablet (500 mg total) by mouth daily. Qty: 4 tablet, Refills: 0      CONTINUE these medications which have CHANGED   Details  hydrochlorothiazide (HYDRODIURIL) 12.5 MG tablet Take 1 tablet (12.5 mg total) by mouth daily. Qty: 30 tablet, Refills: 2    verapamil (CALAN-SR) 120 MG CR tablet Take 1 tablet (120 mg total) by mouth daily. Qty:  30 tablet, Refills: 2      CONTINUE these medications which have NOT CHANGED   Details  apixaban (ELIQUIS) 5 MG TABS tablet Take 5 mg by mouth 2 (two) times daily.    fenofibrate (TRICOR) 48 MG tablet Take 48 mg by mouth daily.    ramipril (ALTACE) 10 MG capsule Take 10 mg by mouth daily.       Ensure alive 1 can po BID   The results of significant diagnostics from this hospitalization (including  imaging, microbiology, ancillary and laboratory) are listed below for reference.    Significant Diagnostic Studies: Ct Abdomen Pelvis W Wo Contrast  Result Date: 06/25/2016 CLINICAL DATA:  Hepatic lesion, failure to thrive, hypokalemia. Pt was slow to follow commands, would not hold still. Moved his arms during arterial scan. EXAM: CT ABDOMEN AND PELVIS WITHOUT AND WITH CONTRAST TECHNIQUE: Multidetector CT imaging of the abdomen and pelvis was performed following the standard protocol before and following the bolus administration of intravenous contrast. CONTRAST:  100mL ISOVUE-300 IOPAMIDOL (ISOVUE-300) INJECTION 61% COMPARISON:  Ultrasound 06/22/2016. FINDINGS: Lower chest: Lung bases are clear. Hepatobiliary: Noncontrast exam demonstrates diffuse low-attenuation within liver consistent with severe hepatic steatosis. There is 1 rounded lesion of fatty sparing in the posterior RIGHT hepatic lobe measuring 20 mm x 19 mm which corresponds to the ultrasound abnormality. This lesion demonstrates mild post contrast enhancement which is typical of fatty sparing. No duct dilatation. Sludge in the gallbladder noted. No gallbladder inflammation identified. Common bile duct normal caliber. Pancreas: Pancreas is normal. No ductal dilatation. No pancreatic inflammation. Spleen: Normal spleen Adrenals/urinary tract: Adrenal glands are normal. Elongated nonobstructing calculus in the RIGHT kidney measuring 8 mm. There is mild pelvic caliectasis on the RIGHT. The proximal RIGHT ureters are normal caliber. 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 There is mild enhancement of the uroepithelial within the RIGHT renal pelvis (image 44, series 13) Stomach/Bowel: Stomach and limited view of the bowel is unremarkable. Vascular/Lymphatic: Malleolar thin caliber. No retroperitoneal adenopathy. Other: No free fluid. Musculoskeletal: No aggressive osseous lesion. IMPRESSION: 1. Severe diffuse hepatic steatosis. 2. Focus of fatty  sparing RIGHT hepatic lobe response to the ultrasound abnormality. 3. Mild enhancement of the urothelium of the RIGHT renal pelvis appear recommend correlation for urinary tract infection Electronically Signed   By: Genevive BiStewart  Edmunds M.D.   On: 06/25/2016 09:42   Ct Head Wo Contrast  Result Date: 06/22/2016 CLINICAL DATA:  Frequent falls, alcohol abuse, dizziness, memory issues EXAM: CT HEAD WITHOUT CONTRAST TECHNIQUE: Contiguous axial images were obtained from the base of the skull through the vertex without intravenous contrast. COMPARISON:  None. FINDINGS: Brain: Diffuse brain atrophy evident with minor chronic white matter microvascular ischemic changes throughout the cerebral hemispheres. No acute intracranial hemorrhage, mass lesion, definite infarction, focal edema, mass effect, midline shift, herniation, hydrocephalus, or extra-axial fluid collection. Cisterns remain patent. Cerebellar atrophy as well. Vascular: No hyperdense vessel or unexpected calcification. Skull: Normal. Negative for fracture or focal lesion. Sinuses/Orbits: No acute finding. Other: None. IMPRESSION: Brain atrophy and mild chronic white matter microvascular ischemic changes. No acute intracranial finding by noncontrast CT. Electronically Signed   By: Judie PetitM.  Shick M.D.   On: 06/22/2016 17:00   [place] Abdomen Limited Ruq  Result Date: 06/22/2016 CLINICAL DATA:  Elevated liver function tests, unspecified disorder of liver function. EXAM: US ABDOMEN LIMITED - RIGHT UPPER QUADRANT COMPARISON:  None. FINDINGS: Gallbladder: The gallbladder is distended in appearance without wall thickening. No sonographic Eulah PontMurphy sign was elicited by the technologist.  There is a moderate amount of biliary sludge contained within the gallbladder. Common bile duct: Diameter: 4 mm. Liver: Diffusely echogenic consistent with fatty infiltration. There is an approximately 2.3 x 2.1 x 2.2 cm hypoechoic focus relative to surrounding liver parenchyma. Although  findings could potentially represent focal fatty sparing, a liver lesion is not entirely excluded. IMPRESSION: 1. Fatty infiltration of the liver. There is a 2.3 x 2.1 x 2.2 cm right hepatic hypoechoic solid-appearing focus which may simply reflect an area of focal fatty sparing. A hepatic lesion however is not entirely excluded. Non-emergent CT or MRI without and with IV contrast may help for further correlation if not documented elsewhere. 2. Tumefactive biliary sludge. No sonographic findings of cholecystitis. Electronically Signed   By: Tollie Ethavid  Kwon M.D.   On: 06/22/2016 17:51    Microbiology: Recent Results (from the past 240 hour(s))  Culture, Urine     Status: Abnormal   Collection Time: 06/25/16  4:10 PM  Result Value Ref Range Status   Specimen Description URINE, CLEAN CATCH  Final   Special Requests NONE  Final   Culture >=100,000 COLONIES/mL ENTEROCOCCUS FAECALIS (A)  Final   Report Status 06/28/2016 FINAL  Final   Organism ID, Bacteria ENTEROCOCCUS FAECALIS (A)  Final      Susceptibility   Enterococcus faecalis - MIC*    AMPICILLIN <=2 SENSITIVE Sensitive     LEVOFLOXACIN 0.5 SENSITIVE Sensitive     NITROFURANTOIN <=16 SENSITIVE Sensitive     VANCOMYCIN 1 SENSITIVE Sensitive     * >=100,000 COLONIES/mL ENTEROCOCCUS FAECALIS     Labs: Basic Metabolic Panel:  Recent Labs Lab 06/25/16 0617 06/29/16 0956 06/30/16 0435  NA 137 137 136  K 4.1 3.5 3.8  CL 97* 107 105  CO2 32 25 26  GLUCOSE 100* 129* 84  BUN 7 7 7   CREATININE 0.90 0.94 0.98  CALCIUM 10.2 9.2 9.3   Liver Function Tests:  Recent Labs Lab 06/25/16 0617  AST 140*  ALT 52  ALKPHOS 111  BILITOT 0.7  PROT 6.4*  ALBUMIN 3.1*   No results for input(s): LIPASE, AMYLASE in the last 168 hours. No results for input(s): AMMONIA in the last 168 hours. CBC:  Recent Labs Lab 06/29/16 0500 06/29/16 0956 06/29/16 2026 06/30/16 0435  WBC 4.4 4.8 5.6 6.0  HGB 6.7* 7.0* 8.1* 8.1*  HCT 20.2* 20.7* 23.7*  23.6*  MCV 90.2 89.6 89.1 89.1  PLT 402* 381 425* 457*    CBG:  Recent Labs Lab 06/27/16 1641  GLUCAP 151*       Signed:  Draven Laine S MD.  [facility] 06/30/2016, 11:42 AM

## 2016-06-30 NOTE — Progress Notes (Signed)
Discontinued patient's tele sitter for discharge.

## 2016-12-25 DEATH — deceased

## 2018-07-08 IMAGING — CT CT ABD-PEL WO/W CM
2 of 11 series · 12 of 46 positions shown, 18 images · IV contrast (iopamidol)
Comparison: Ultrasound 06/22/2016.

CLINICAL DATA: Hepatic lesion, failure to thrive, hypokalemia. Pt
was slow to follow commands, would not hold still. Moved his arms
during arterial scan.

EXAM:
CT ABDOMEN AND PELVIS WITHOUT AND WITH CONTRAST
TECHNIQUE: Multidetector CT imaging of the abdomen and pelvis was performed
following the standard protocol before and following the bolus
administration of intravenous contrast.
CONTRAST:  100mL [id number] IOPAMIDOL (IE3DIJ-777) INJECTION 61%

[Series 4: coronal arterial · coronal · arterial · 0.58mm/px · 3 of 88 slices shown]
[im 22/88  soft-tissue]
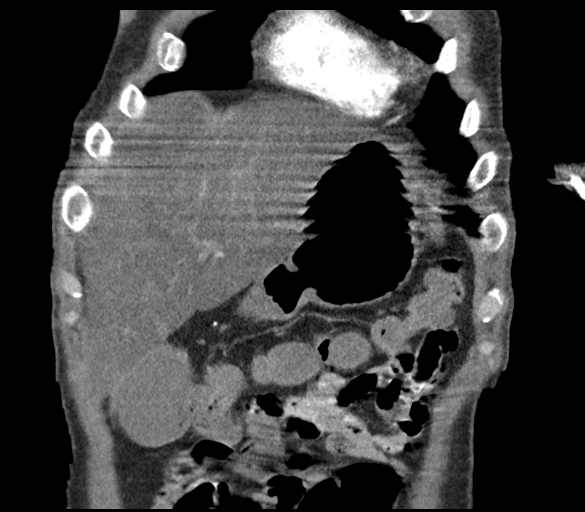
[im 44/88  soft-tissue]
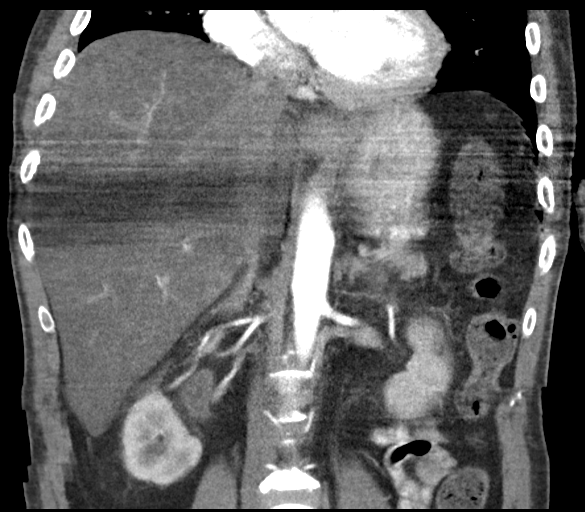
[im 66/88  soft-tissue]
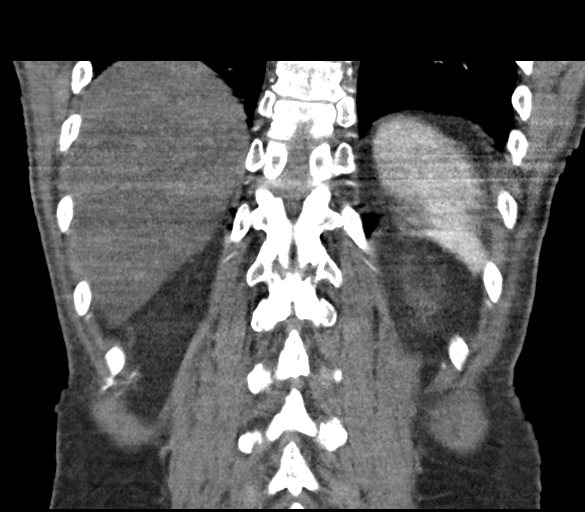

[Series 8: axial venous · axial · portal-venous · 0.71mm/px · z∈[-513,-78]mm · 9 of 179 slices shown, 15 images]
[im 17/179  soft-tissue]
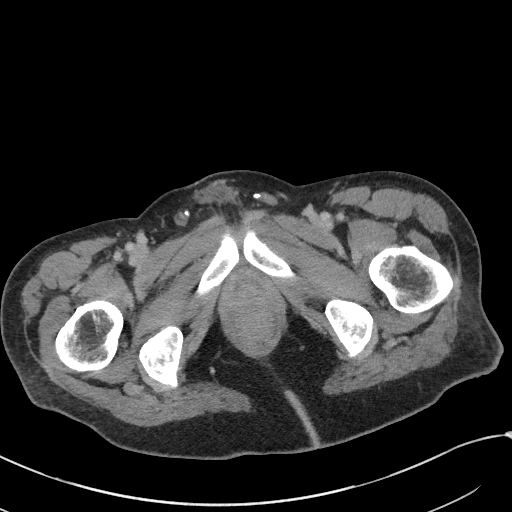
[im 17/179  bone]
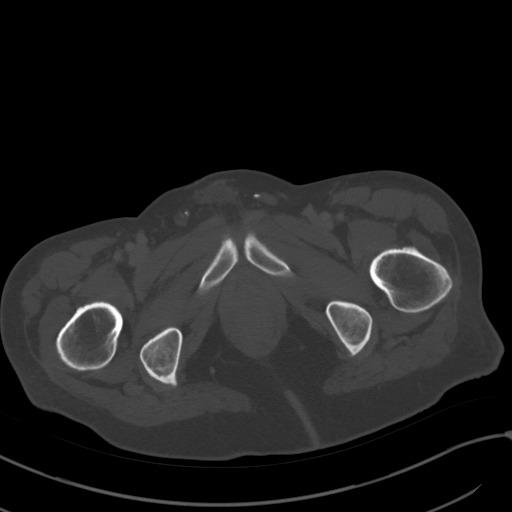
[im 33/179  soft-tissue]
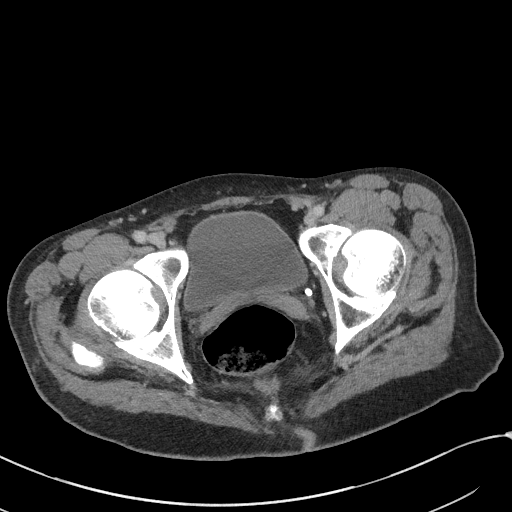
[im 49/179  soft-tissue]
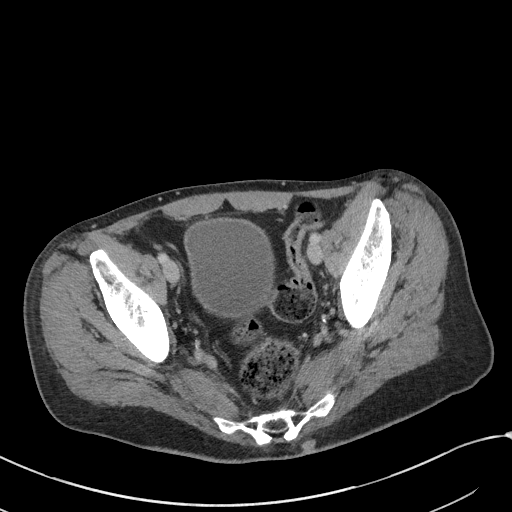
[im 65/179  soft-tissue]
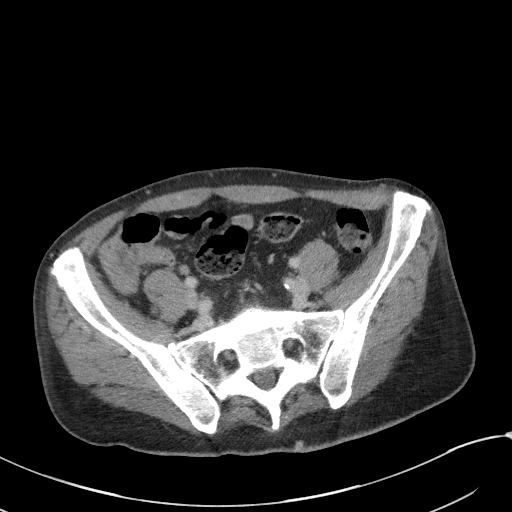
[im 98/179  soft-tissue]
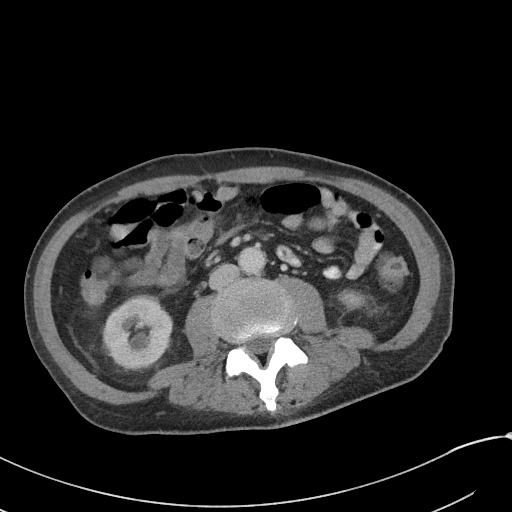
[im 114/179  soft-tissue]
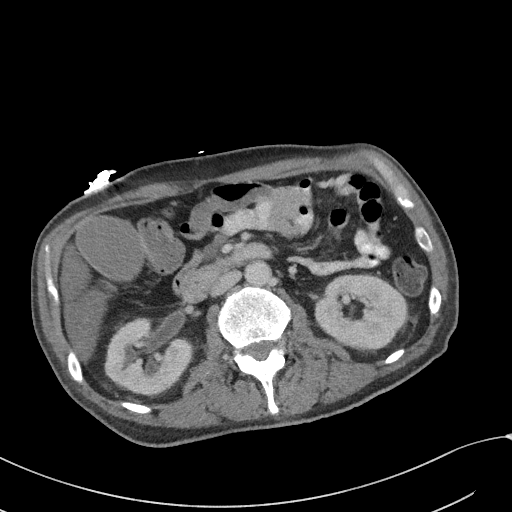
[im 114/179  lung]
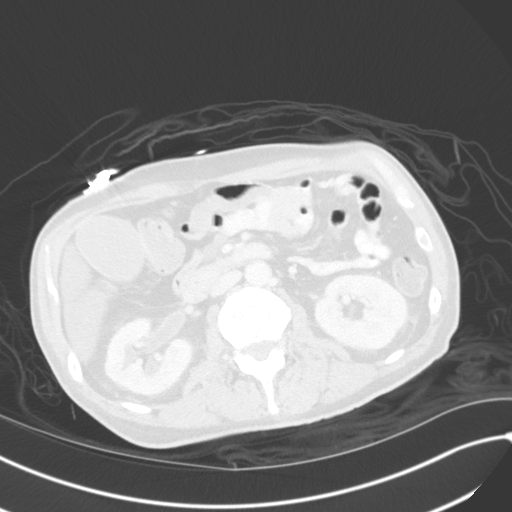
[im 130/179  soft-tissue]
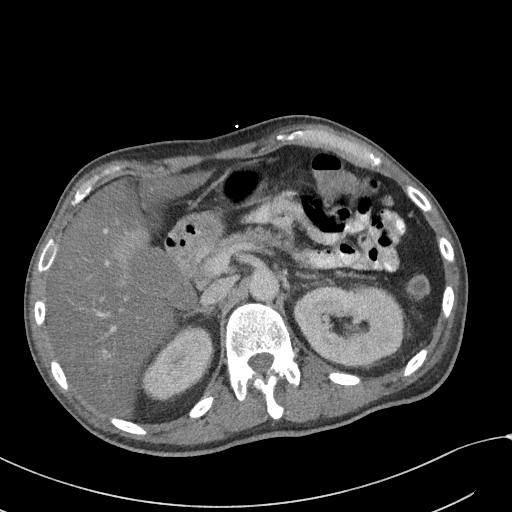
[im 130/179  lung]
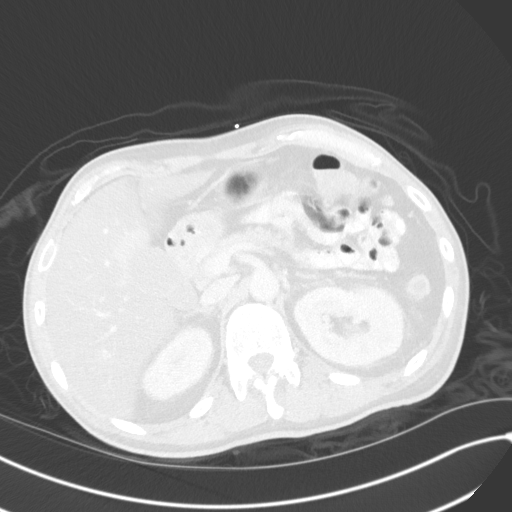
[im 146/179  soft-tissue]
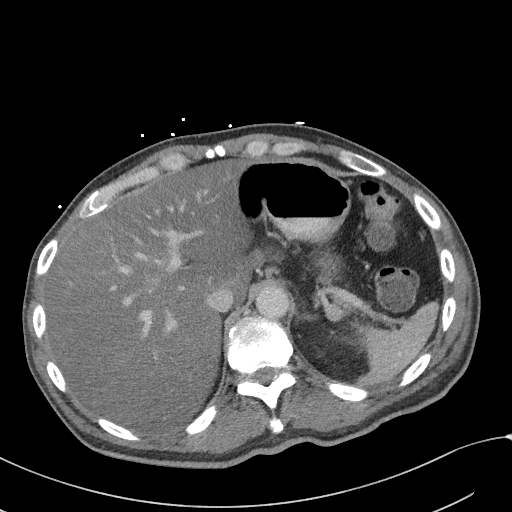
[im 146/179  lung]
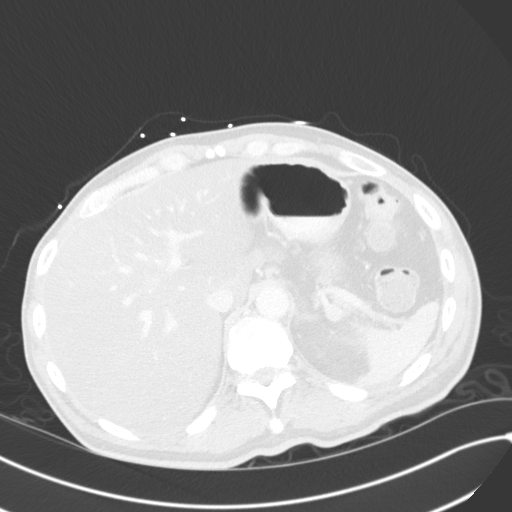
[im 162/179  soft-tissue]
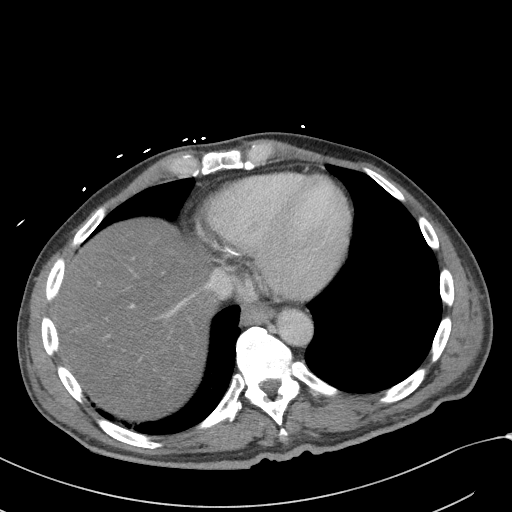
[im 162/179  lung]
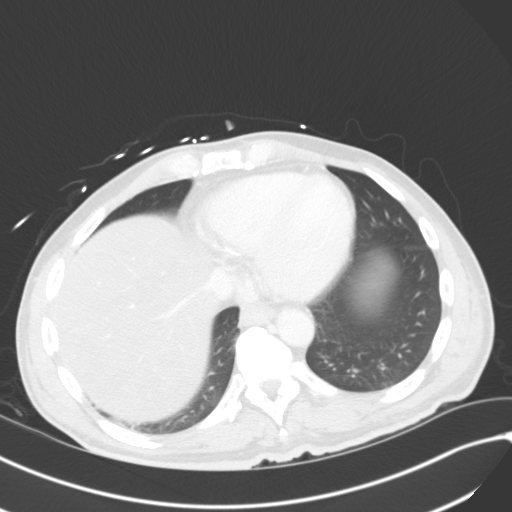
[im 162/179  bone]
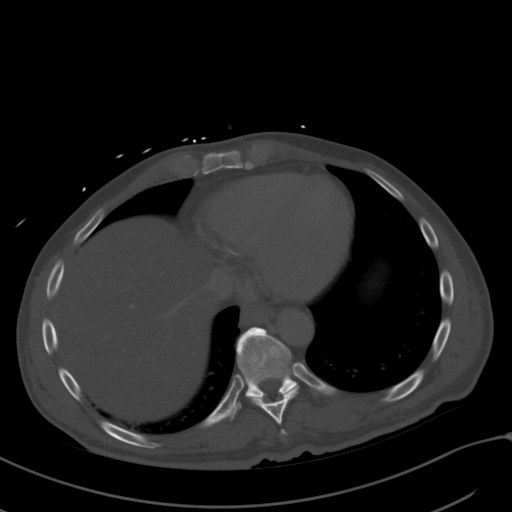

[12 of 46 positions shown; findings below may reference images not displayed]

FINDINGS: Lower chest: Lung bases are clear.

Hepatobiliary: Noncontrast exam demonstrates diffuse low-attenuation
within liver consistent with severe hepatic steatosis. There is 1
rounded lesion of fatty sparing in the posterior RIGHT hepatic lobe
measuring 20 mm x 19 mm which corresponds to the ultrasound
abnormality. This lesion demonstrates mild post contrast enhancement
which is typical of fatty sparing. No duct dilatation. Sludge in the
gallbladder noted. No gallbladder inflammation identified. Common
bile duct normal caliber.

Pancreas: Pancreas is normal. No ductal dilatation. No pancreatic
inflammation.

Spleen: Normal spleen

Adrenals/urinary tract: Adrenal glands are normal. Elongated
nonobstructing calculus in the RIGHT kidney measuring 8 mm. There is
mild pelvic caliectasis on the RIGHT. The proximal RIGHT ureters are
normal caliber. Duplicated collecting system on the RIGHT. There is
mild enhancement of the uroepithelial within the RIGHT renal pelvis
(image 44, series 13)

Stomach/Bowel: Stomach and limited view of the bowel is
unremarkable.

Vascular/Lymphatic: Malleolar thin caliber. No retroperitoneal
adenopathy.

Other: No free fluid.

Musculoskeletal: No aggressive osseous lesion.
IMPRESSION: 1. Severe diffuse hepatic steatosis.
2. Focus of fatty sparing RIGHT hepatic lobe response to the
ultrasound abnormality.
3. Mild enhancement of the urothelium of the RIGHT renal pelvis
appear recommend correlation for urinary tract infection
